# Patient Record
Sex: Female | Born: 1935 | Race: White | Hispanic: No | Marital: Married | Smoking: Never smoker
Health system: Southern US, Community
[De-identification: ages and names within clinical notes are randomized; demographics above are authoritative.]

## PROBLEM LIST (undated history)

## (undated) DIAGNOSIS — I10 Essential (primary) hypertension: Secondary | ICD-10-CM

## (undated) DIAGNOSIS — G8321 Monoplegia of upper limb affecting right dominant side: Secondary | ICD-10-CM

## (undated) DIAGNOSIS — D649 Anemia, unspecified: Secondary | ICD-10-CM

## (undated) DIAGNOSIS — C801 Malignant (primary) neoplasm, unspecified: Secondary | ICD-10-CM

## (undated) DIAGNOSIS — R569 Unspecified convulsions: Secondary | ICD-10-CM

## (undated) DIAGNOSIS — H9190 Unspecified hearing loss, unspecified ear: Secondary | ICD-10-CM

## (undated) DIAGNOSIS — R42 Dizziness and giddiness: Secondary | ICD-10-CM

## (undated) DIAGNOSIS — J189 Pneumonia, unspecified organism: Secondary | ICD-10-CM

## (undated) DIAGNOSIS — R269 Unspecified abnormalities of gait and mobility: Secondary | ICD-10-CM

## (undated) HISTORY — PX: BRAIN SURGERY: SHX531

---

## 2013-05-25 ENCOUNTER — Encounter (HOSPITAL_COMMUNITY): Payer: Self-pay | Admitting: *Deleted

## 2013-05-25 ENCOUNTER — Emergency Department (HOSPITAL_COMMUNITY): Payer: MEDICARE

## 2013-05-25 ENCOUNTER — Inpatient Hospital Stay (HOSPITAL_COMMUNITY): Payer: MEDICARE

## 2013-05-25 ENCOUNTER — Inpatient Hospital Stay (HOSPITAL_COMMUNITY)
Admission: EM | Admit: 2013-05-25 | Discharge: 2013-05-29 | DRG: 070 | Disposition: A | Discharge status: Home / Self Care | Payer: MEDICARE | Attending: Internal Medicine | Admitting: Internal Medicine

## 2013-05-25 DIAGNOSIS — Z87820 Personal history of traumatic brain injury: Secondary | ICD-10-CM

## 2013-05-25 DIAGNOSIS — Z87891 Personal history of nicotine dependence: Secondary | ICD-10-CM

## 2013-05-25 DIAGNOSIS — R569 Unspecified convulsions: Secondary | ICD-10-CM

## 2013-05-25 DIAGNOSIS — J189 Pneumonia, unspecified organism: Secondary | ICD-10-CM | POA: Diagnosis present

## 2013-05-25 DIAGNOSIS — E876 Hypokalemia: Secondary | ICD-10-CM | POA: Diagnosis not present

## 2013-05-25 DIAGNOSIS — R6889 Other general symptoms and signs: Secondary | ICD-10-CM

## 2013-05-25 DIAGNOSIS — G9389 Other specified disorders of brain: Secondary | ICD-10-CM | POA: Diagnosis present

## 2013-05-25 DIAGNOSIS — I2510 Atherosclerotic heart disease of native coronary artery without angina pectoris: Secondary | ICD-10-CM | POA: Diagnosis present

## 2013-05-25 DIAGNOSIS — G40909 Epilepsy, unspecified, not intractable, without status epilepticus: Secondary | ICD-10-CM | POA: Diagnosis present

## 2013-05-25 DIAGNOSIS — G9340 Encephalopathy, unspecified: Principal | ICD-10-CM | POA: Diagnosis present

## 2013-05-25 DIAGNOSIS — I10 Essential (primary) hypertension: Secondary | ICD-10-CM | POA: Diagnosis present

## 2013-05-25 DIAGNOSIS — Z79899 Other long term (current) drug therapy: Secondary | ICD-10-CM

## 2013-05-25 DIAGNOSIS — R4182 Altered mental status, unspecified: Secondary | ICD-10-CM | POA: Diagnosis present

## 2013-05-25 DIAGNOSIS — R32 Unspecified urinary incontinence: Secondary | ICD-10-CM | POA: Diagnosis present

## 2013-05-25 HISTORY — DX: Pneumonia, unspecified organism: J18.9

## 2013-05-25 HISTORY — DX: Essential (primary) hypertension: I10

## 2013-05-25 LAB — RAPID URINE DRUG SCREEN, HOSP PERFORMED
Amphetamines: NOT DETECTED
Barbiturates: POSITIVE — AB
Benzodiazepines: NOT DETECTED
Cocaine: NOT DETECTED
Opiates: NOT DETECTED
Tetrahydrocannabinol: NOT DETECTED

## 2013-05-25 LAB — POCT I-STAT, CHEM 8
Chloride: 100 mEq/L (ref 96–112)
Creatinine, Ser: 0.7 mg/dL (ref 0.50–1.10)
HCT: 43 % (ref 36.0–46.0)
Hemoglobin: 14.6 g/dL (ref 12.0–15.0)
Potassium: 3.6 mEq/L (ref 3.5–5.1)
Sodium: 136 mEq/L (ref 135–145)

## 2013-05-25 LAB — COMPREHENSIVE METABOLIC PANEL
AST: 26 U/L (ref 0–37)
Albumin: 4.1 g/dL (ref 3.5–5.2)
BUN: 16 mg/dL (ref 6–23)
CO2: 25 mEq/L (ref 19–32)
Calcium: 9 mg/dL (ref 8.4–10.5)
Chloride: 96 mEq/L (ref 96–112)
Creatinine, Ser: 0.66 mg/dL (ref 0.50–1.10)
GFR calc Af Amer: >90 mL/min (ref >90)
GFR calc non Af Amer: 82 mL/min — ABNORMAL LOW (ref >90)
Glucose, Bld: 111 mg/dL — ABNORMAL HIGH (ref 70–99)
Total Bilirubin: 0.4 mg/dL (ref 0.3–1.2)

## 2013-05-25 LAB — URINE MICROSCOPIC-ADD ON

## 2013-05-25 LAB — CBC
HCT: 37.3 % (ref 36.0–46.0)
Hemoglobin: 12.9 g/dL (ref 12.0–15.0)
MCV: 96.6 fL (ref 78.0–100.0)
Platelets: 136 10*3/uL — ABNORMAL LOW (ref 150–400)
WBC: 9.1 10*3/uL (ref 4.0–10.5)

## 2013-05-25 LAB — URINALYSIS, ROUTINE W REFLEX MICROSCOPIC
Nitrite: NEGATIVE
Protein, ur: NEGATIVE mg/dL
Specific Gravity, Urine: 1.012 (ref 1.005–1.030)
Urobilinogen, UA: 0.2 mg/dL (ref 0.0–1.0)
pH: 6 (ref 5.0–8.0)

## 2013-05-25 LAB — PHENYTOIN LEVEL, TOTAL: Phenytoin Lvl: 5.1 ug/mL — ABNORMAL LOW (ref 10.0–20.0)

## 2013-05-25 LAB — ETHANOL: Alcohol, Ethyl (B): <11 mg/dL (ref 0–11)

## 2013-05-25 LAB — APTT: aPTT: 31 seconds (ref 24–37)

## 2013-05-25 LAB — TROPONIN I: Troponin I: <0.3 ng/mL (ref <0.30)

## 2013-05-25 LAB — DIFFERENTIAL
Eosinophils Relative: 1 % (ref 0–5)
Lymphocytes Relative: 8 % — ABNORMAL LOW (ref 12–46)
Monocytes Absolute: 0.7 10*3/uL (ref 0.1–1.0)
Monocytes Relative: 8 % (ref 3–12)
Neutro Abs: 7.6 10*3/uL (ref 1.7–7.7)

## 2013-05-25 LAB — PROTIME-INR: Prothrombin Time: 14.1 seconds (ref 11.6–15.2)

## 2013-05-25 MED ORDER — RALOXIFENE HCL 60 MG PO TABS
60.0000 mg | ORAL_TABLET | Freq: Every day | ORAL | Status: DC
  Administered 2013-05-26 – 2013-05-29 (×4): 60 mg via ORAL
  Filled 2013-05-25 (×4): qty 1

## 2013-05-25 MED ORDER — SODIUM CHLORIDE 0.9 % IV BOLUS (SEPSIS)
1000.0000 mL | Freq: Once | INTRAVENOUS | Status: AC
  Administered 2013-05-25: 1000 mL via INTRAVENOUS

## 2013-05-25 MED ORDER — PHENYTOIN SODIUM EXTENDED 100 MG PO CAPS
100.0000 mg | ORAL_CAPSULE | Freq: Two times a day (BID) | ORAL | Status: DC
  Administered 2013-05-25: 100 mg via ORAL
  Filled 2013-05-25 (×3): qty 1

## 2013-05-25 MED ORDER — OSELTAMIVIR PHOSPHATE 75 MG PO CAPS
75.0000 mg | ORAL_CAPSULE | Freq: Two times a day (BID) | ORAL | Status: DC
  Administered 2013-05-26 – 2013-05-27 (×3): 75 mg via ORAL
  Filled 2013-05-25 (×5): qty 1

## 2013-05-25 MED ORDER — VITAMIN D3 25 MCG (1000 UNIT) PO TABS
1000.0000 [IU] | ORAL_TABLET | Freq: Every day | ORAL | Status: DC
  Administered 2013-05-26 – 2013-05-29 (×4): 1000 [IU] via ORAL
  Filled 2013-05-25 (×4): qty 1

## 2013-05-25 MED ORDER — ACETAMINOPHEN 325 MG PO TABS
650.0000 mg | ORAL_TABLET | Freq: Four times a day (QID) | ORAL | Status: DC | PRN
Start: 2013-05-25 — End: 2013-05-29
  Filled 2013-05-25: qty 2

## 2013-05-25 MED ORDER — SODIUM CHLORIDE 0.9 % IJ SOLN
3.0000 mL | Freq: Two times a day (BID) | INTRAMUSCULAR | Status: DC
  Administered 2013-05-25 – 2013-05-28 (×6): 3 mL via INTRAVENOUS

## 2013-05-25 MED ORDER — SODIUM CHLORIDE 0.9 % IV SOLN: INTRAVENOUS | Status: DC

## 2013-05-25 MED ORDER — OSELTAMIVIR PHOSPHATE 75 MG PO CAPS
75.0000 mg | ORAL_CAPSULE | Freq: Once | ORAL | Status: AC
  Administered 2013-05-25: 75 mg via ORAL
  Filled 2013-05-25 (×2): qty 1

## 2013-05-25 MED ORDER — VITAMIN B-12 1000 MCG PO TABS
1000.0000 ug | ORAL_TABLET | Freq: Every day | ORAL | Status: DC
  Administered 2013-05-26 – 2013-05-29 (×4): 1000 ug via ORAL
  Filled 2013-05-25 (×4): qty 1

## 2013-05-25 MED ORDER — PRIMIDONE 250 MG PO TABS
250.0000 mg | ORAL_TABLET | Freq: Two times a day (BID) | ORAL | Status: DC
  Administered 2013-05-25 – 2013-05-29 (×8): 250 mg via ORAL
  Filled 2013-05-25 (×9): qty 1

## 2013-05-25 MED ORDER — LABETALOL HCL 200 MG PO TABS
200.0000 mg | ORAL_TABLET | Freq: Two times a day (BID) | ORAL | Status: DC
  Administered 2013-05-25 – 2013-05-29 (×8): 200 mg via ORAL
  Filled 2013-05-25 (×9): qty 1

## 2013-05-25 MED ORDER — IOHEXOL 300 MG/ML  SOLN
75.0000 mL | Freq: Once | INTRAMUSCULAR | Status: AC | PRN
  Administered 2013-05-25: 75 mL via INTRAVENOUS

## 2013-05-25 MED ORDER — ONDANSETRON HCL 4 MG/2ML IJ SOLN: 4.0000 mg | Freq: Four times a day (QID) | INTRAMUSCULAR | Status: DC | PRN

## 2013-05-25 MED ORDER — ENOXAPARIN SODIUM 40 MG/0.4ML ~~LOC~~ SOLN
40.0000 mg | SUBCUTANEOUS | Status: DC
  Administered 2013-05-25 – 2013-05-28 (×4): 40 mg via SUBCUTANEOUS
  Filled 2013-05-25 (×5): qty 0.4

## 2013-05-25 MED ORDER — GABAPENTIN 600 MG PO TABS
600.0000 mg | ORAL_TABLET | Freq: Three times a day (TID) | ORAL | Status: DC
  Administered 2013-05-25 – 2013-05-29 (×11): 600 mg via ORAL
  Filled 2013-05-25 (×14): qty 1

## 2013-05-25 MED ORDER — ONDANSETRON HCL 4 MG PO TABS: 4.0000 mg | ORAL_TABLET | Freq: Four times a day (QID) | ORAL | Status: DC | PRN

## 2013-05-25 MED ORDER — ACETAMINOPHEN 325 MG PO TABS
650.0000 mg | ORAL_TABLET | Freq: Once | ORAL | Status: AC
  Administered 2013-05-25: 650 mg via ORAL
  Filled 2013-05-25: qty 2

## 2013-05-25 MED ORDER — PIPERACILLIN-TAZOBACTAM 3.375 G IVPB
3.3750 g | Freq: Three times a day (TID) | INTRAVENOUS | Status: DC
  Administered 2013-05-26 – 2013-05-29 (×10): 3.375 g via INTRAVENOUS
  Filled 2013-05-25 (×12): qty 50

## 2013-05-25 MED ORDER — GABAPENTIN 600 MG PO TABS
600.0000 mg | ORAL_TABLET | Freq: Two times a day (BID) | ORAL | Status: DC
  Filled 2013-05-25: qty 1

## 2013-05-25 MED ORDER — PIPERACILLIN-TAZOBACTAM 3.375 G IVPB
3.3750 g | Freq: Once | INTRAVENOUS | Status: AC
  Administered 2013-05-25: 3.375 g via INTRAVENOUS
  Filled 2013-05-25: qty 50

## 2013-05-25 MED ORDER — ACETAMINOPHEN 650 MG RE SUPP: 650.0000 mg | Freq: Four times a day (QID) | RECTAL | Status: DC | PRN

## 2013-05-25 MED ORDER — DOCUSATE SODIUM 100 MG PO CAPS
100.0000 mg | ORAL_CAPSULE | Freq: Two times a day (BID) | ORAL | Status: DC
  Administered 2013-05-25 – 2013-05-27 (×5): 100 mg via ORAL
  Filled 2013-05-25 (×5): qty 1

## 2013-05-25 NOTE — Progress Notes (Signed)
ANTIBIOTIC CONSULT NOTE - INITIAL  Pharmacy Consult for zosyn Indication: rule out pneumonia  No Known Allergies  Patient Measurements:   Adjusted Body Weight:   Vital Signs: Temp: 101.4 F (38.6 C) (12/26 1825) Temp src: Rectal (12/26 1634) BP: 118/92 mmHg (12/26 1634) Pulse Rate: 101 (12/26 1634) Intake/Output from previous day:   Intake/Output from this shift:    Labs:  Recent Labs  05/25/13 1608 05/25/13 1623  WBC 9.1  --   HGB 12.9 14.6  PLT 136*  --   CREATININE 0.66 0.70   CrCl is unknown because there is no height on file for the current visit. No results found for this basename: VANCOTROUGH, VANCOPEAK, VANCORANDOM, GENTTROUGH, GENTPEAK, GENTRANDOM, TOBRATROUGH, TOBRAPEAK, TOBRARND, AMIKACINPEAK, AMIKACINTROU, AMIKACIN,  in the last 72 hours   Microbiology: No results found for this or any previous visit (from the past 720 hour(s)).  Medical History: No past medical history on file.  Medications:  Anti-infectives   Start     Dose/Rate Route Frequency Ordered Stop   05/26/13 0100  piperacillin-tazobactam (ZOSYN) IVPB 3.375 g     3.375 g 12.5 mL/hr over 240 Minutes Intravenous Every 8 hours 05/25/13 1945     05/25/13 2200  oseltamivir (TAMIFLU) capsule 75 mg     75 mg Oral 2 times daily 05/25/13 1841 05/30/13 2159   05/25/13 1800  piperacillin-tazobactam (ZOSYN) IVPB 3.375 g     3.375 g 12.5 mL/hr over 240 Minutes Intravenous  Once 05/25/13 1751     05/25/13 1745  oseltamivir (TAMIFLU) capsule 75 mg     75 mg Oral  Once 05/25/13 1739 05/25/13 1856     Assessment: 81 yof presented to the hospital with AMS and called as a code stroke. To start empiric zosyn for r/o aspiration pneumonia. Also starting on empiric tamiflu. Tmax is 101.4, WBC WNL, Scr WNL.   Zosyn 12/26>> Tamiflu 12/26>>  Goal of Therapy:  Eradication of infection  Plan:  1. Zosyn 3.375gm IV x 1 over 30 minutes then 3.375gm IV Q8H (4 hr inf) 2. F/u renal fxn, C&S, clinical  status  Navea Woodrow, Drake Leach 05/25/2013,7:46 PM

## 2013-05-25 NOTE — ED Provider Notes (Signed)
CSN: [id number]     Arrival date & time 05/25/13  1612 History   First MD Initiated Contact with Patient 05/25/13 1613     Chief Complaint  Patient presents with  . Code Stroke   (Consider location/radiation/quality/duration/timing/severity/associated sxs/prior Treatment) HPI Comments: 80 year old female brought in by EMS for altered mental status. EMS called a code stroke as the patient was apparently at her normal baseline approximately one hour prior to arrival. The patient woke up this morning with fever up to 102, dry cough, sore throat/hoarseness, and diffuse muscle aches. The patient went to go lay down and then when the husband went to check on her found her on the ground. The patient seemed to fall off the bed. He thinks she is trying to get out of bed. 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 His weakness is not new today. Is not worse today. Patient does have a seizure history has not been missing her seizure medicines. The patient is altered when initially seen by me in the history is taken from the husband.   No past medical history on file. No past surgical history on file. No family history on file. History  Substance Use Topics  . Smoking status: Not on file  . Smokeless tobacco: Not on file  . Alcohol Use: Not on file   OB History   No data available     Review of Systems  Unable to perform ROS: Mental status change    Allergies  Review of patient's allergies indicates not on file.  Home Medications  No current outpatient prescriptions on file. BP 118/92  Pulse 101  Temp(Src) 101.4 F (38.6 C) (Rectal)  SpO2 92% Physical Exam  Vitals reviewed. Constitutional: She appears well-developed and well-nourished.  HENT:  Head: Normocephalic and atraumatic.  Right Ear: External ear normal.  Left Ear: External ear normal.  Nose: Nose normal.  Eyes: Right eye exhibits no discharge. Left eye exhibits no  discharge.  Cardiovascular: Regular rhythm and normal heart sounds.  Tachycardia present.   Pulmonary/Chest: Effort normal. She has rhonchi (intermittent).  Abdominal: Soft. There is no tenderness.  Neurological: She is alert. She is disoriented. No cranial nerve deficit. GCS eye subscore is 4. GCS verbal subscore is 4. GCS motor subscore is 6.  Chronic right-sided weakness, not worse than normal. Left side is at 5 out of 5 strength. No gross cranial nerve abnormality.  Skin: Skin is warm and dry.    ED Course  Procedures (including critical care time) Labs Review Labs Reviewed  CBC - Abnormal; Notable for the following:    RBC 3.86 (*)    Platelets 136 (*)    All other components within normal limits  DIFFERENTIAL - Abnormal; Notable for the following:    Neutrophils Relative % 84 (*)    Lymphocytes Relative 8 (*)    All other components within normal limits  COMPREHENSIVE METABOLIC PANEL - Abnormal; Notable for the following:    Sodium 134 (*)    Glucose, Bld 111 (*)    Alkaline Phosphatase 149 (*)    GFR calc non Af Amer 82 (*)    All other components within normal limits  GLUCOSE, CAPILLARY - Abnormal; Notable for the following:    Glucose-Capillary 111 (*)    All other components within normal limits  POCT I-STAT, CHEM 8 - Abnormal; Notable for the following:    Glucose, Bld 114 (*)  Calcium, Ion 1.07 (*)    All other components within normal limits  CULTURE, BLOOD (ROUTINE X 2)  CULTURE, BLOOD (ROUTINE X 2)  ETHANOL  PROTIME-INR  APTT  TROPONIN I  URINE RAPID DRUG SCREEN (HOSP PERFORMED)  URINALYSIS, ROUTINE W REFLEX MICROSCOPIC  PHENYTOIN LEVEL, TOTAL  INFLUENZA PANEL BY PCR  POCT I-STAT TROPONIN I   Imaging Review Ct Head Wo Contrast  05/25/2013   CLINICAL DATA:  Code stroke. Slurred speech. Question seizure. History of traumatic brain injury as a child.  EXAM: CT HEAD WITHOUT CONTRAST  TECHNIQUE: Contiguous axial images were obtained from the base of the  skull through the vertex without intravenous contrast.  COMPARISON:  None.  FINDINGS: Chronic encephalomalacia is present in the anterior left frontal lobe. There is a calvarial defect over this area which appears somewhat expanded. Ex vacuo dilation is noted in the left lateral ventricle. Moderate generalized atrophy and diffuse white matter disease is present bilaterally.  No acute cortical infarct, hemorrhage, or mass lesion is present. The ventricles are of normal size. No significant extra-axial fluid collection is present otherwise.  The paranasal sinuses and mastoid air cells are clear. The osseous skull is otherwise intact.  IMPRESSION: 1. No acute intracranial abnormality. 2. Chronic left frontal lobe encephalomalacia. 3. Moderate generalized white matter disease. This likely reflects the sequela of chronic microvascular ischemia. 4. The left frontal calvarial defect. This appears postsurgical of may be expanded. 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 Comparison with previous imaging would be useful. These results were called by telephone at the time of interpretation on 05/25/2013 at 4:29 PM to Dr. Ritta Slot , who verbally acknowledged these results.   Electronically Signed   By: Gennette Pac M.D.   On: 05/25/2013 16:29   Dg Chest Portable 1 View  05/25/2013   CLINICAL DATA:  Code stroke patient.  Slurred speech.  EXAM: PORTABLE CHEST - 1 VIEW  COMPARISON:  None.  FINDINGS: The patient has a right hilar mass measuring 5.9 cm craniocaudal by 4.5 cm transverse. The lungs appear emphysematous. No pneumothorax identified. Skin folds over the upper chest are noted. Heart size is upper normal.  IMPRESSION: Right hilar mass lesion. CT chest with contrast recommended for further evaluation.   Electronically Signed   By: Drusilla Kanner M.D.   On: 05/25/2013 17:13    EKG Interpretation   None       MDM   1. Altered mental status   2. Flu-like symptoms   3. Seizure    Patient's  mental status improved. She was able to follow commands but still seemed a little out of it per the family. As she is febrile, has multiple risk factors, and clinically has influenza I feel she needs admission to the hospital. Her altered mental status seems most consistent with a postictal state, however delirium due to influence is also a possibility. D/w hospitalist, will cover for flu with tamiflu and will cover for aspiration with zosyn per her request. No lobar PNA seen. Discussed mass with family, no prior history of it. Will need CT as inpatient.     Audree Camel, MD 05/26/13 [phone]

## 2013-05-25 NOTE — H&P (Signed)
[facility] History and Physical  Lisa Eaton QQV:[id number] DOB: 04-02-1932 DOA: 05/25/2013  Referring physician: Dr Lawrence Marseilles PCP: No primary provider on file.   Chief Complaint: AMS  HPI: Lisa Eaton is a 79 y.o. female with PMH significant for seizure disorder, brain trauma and brain surgery, chronic right side weakness who was found on the floor by husban. Patient was notice to be confuse, incontinent of urine, lethargic at that time. At 9;30 am patient took a nap. She was still sleeping at 13;00. Around 15;30 she was found on the floor by husband. Patient was notice to be febrile at that time at 101. She was initially a CODE stroke but it was cancelled.  Patient has been sick for last 2 days. She has had fever, generalized body aches, sore throat. No cough, no chest pain, no dyspnea. No tongue bite. Patient on initial evaluation was confuse, didn't know where she was at.    Review of Systems:  Negative except as per HPI.   past medical history; hypertension. Seizure, CAD, Brain trauma as a child, brain surgery, partial paralysis of right side,                         Surgical history; brain surgery.  Social History:  Used to smoke quit 20 years ago, she is retired from [employer], she drinks a glass of wine evrey night.   No Known Allergies  Family History; Father; multiple sclerosis.   Prior to Admission medications   Medication Sig Start Date End Date Taking? Authorizing Provider  amLODipine (NORVASC) 5 MG tablet Take 5 mg by mouth daily.   Yes Historical Provider, MD  cholecalciferol (VITAMIN D) 1000 UNITS tablet Take 1,000 Units by mouth daily.   Yes Historical Provider, MD  gabapentin (NEURONTIN) 600 MG tablet Take 600 mg by mouth 2 (two) times daily.   Yes Historical Provider, MD  labetalol (NORMODYNE) 200 MG tablet Take 200 mg by mouth 2 (two) times daily.   Yes Historical Provider, MD  losartan (COZAAR) 100 MG tablet Take 100 mg by mouth daily.   Yes Historical  Provider, MD  OVER THE COUNTER MEDICATION Take 1 tablet by mouth 2 (two) times daily. Medication:  Preseruision   Yes Historical Provider, MD  phenytoin (DILANTIN) 100 MG ER capsule Take 100 mg by mouth 2 (two) times daily.   Yes Historical Provider, MD  primidone (MYSOLINE) 250 MG tablet Take 250 mg by mouth 2 (two) times daily.   Yes Historical Provider, MD  raloxifene (EVISTA) 60 MG tablet Take 60 mg by mouth daily.   Yes Historical Provider, MD  vitamin B-12 (CYANOCOBALAMIN) 1000 MCG tablet Take 1,000 mcg by mouth daily.   Yes Historical Provider, MD   Physical Exam: Filed Vitals:   05/25/13 1825  BP:   Pulse:   Temp: 101.4 F (38.6 C)    BP 118/92  Pulse 101  Temp(Src) 101.4 F (38.6 C) (Rectal)  SpO2 92%  General:  Appears calm and comfortable, ill.  Eyes: PERRL, normal lids, irises & conjunctiva ENT: grossly normal hearing, lips & tongue dry.  Neck: no LAD, masses or thyromegaly Cardiovascular: Tachycardia RRR, no m/r/g. No LE edema. Telemetry: SR, tachycardia Respiratory: CTA bilaterally, no w/r/r. Normal respiratory effort. Abdomen: soft, ntnd Skin: no rash or induration seen on limited exam Musculoskeletal: grossly normal tone BUE/BLE Neurologic: oriented to place , person, situation. LE 5/5 streghn, left upper extremity 5/5, right upper extremity 3/5, speech is clear, slow  response. Lethargic, wake up to answer some question.           Labs on Admission:  Basic Metabolic Panel:  Recent Labs Lab 05/25/13 1608 05/25/13 1623  NA 134* 136  K 3.5 3.6  CL 96 100  CO2 25  --   GLUCOSE 111* 114*  BUN 16 17  CREATININE 0.66 0.70  CALCIUM 9.0  --    Liver Function Tests:  Recent Labs Lab 05/25/13 1608  AST 26  ALT 24  ALKPHOS 149*  BILITOT 0.4  PROT 7.4  ALBUMIN 4.1   CBC:  Recent Labs Lab 05/25/13 1608 05/25/13 1623  WBC 9.1  --   NEUTROABS 7.6  --   HGB 12.9 14.6  HCT 37.3 43.0  MCV 96.6  --   PLT 136*  --    Cardiac Enzymes:  Recent  Labs Lab 05/25/13 1608  TROPONINI <0.30    BNP (last 3 results) No results found for this basename: PROBNP,  in the last 8760 hours CBG:  Recent Labs Lab 05/25/13 1634  GLUCAP 111*    Radiological Exams on Admission: Ct Head Wo Contrast  05/25/2013   CLINICAL DATA:  Code stroke. Slurred speech. Question seizure. History of traumatic brain injury as a child.  EXAM: CT HEAD WITHOUT CONTRAST  TECHNIQUE: Contiguous axial images were obtained from the base of the skull through the vertex without intravenous contrast.  COMPARISON:  None.  FINDINGS: Chronic encephalomalacia is present in the anterior left frontal lobe. There is a calvarial defect over this area which appears somewhat expanded. Ex vacuo dilation is noted in the left lateral ventricle. Moderate generalized atrophy and diffuse white matter disease is present bilaterally.  No acute cortical infarct, hemorrhage, or mass lesion is present. The ventricles are of normal size. No significant extra-axial fluid collection is present otherwise.  The paranasal sinuses and mastoid air cells are clear. The osseous skull is otherwise intact.  IMPRESSION: 1. No acute intracranial abnormality. 2. Chronic left frontal lobe encephalomalacia. 3. Moderate generalized white matter disease. This likely reflects the sequela of chronic microvascular ischemia. 4. The left frontal calvarial defect. This appears postsurgical of may be expanded. 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 Comparison with previous imaging would be useful. These results were called by telephone at the time of interpretation on 05/25/2013 at 4:29 PM to Dr. Ritta Slot , who verbally acknowledged these results.   Electronically Signed   By: Gennette Pac M.D.   On: 05/25/2013 16:29   Dg Chest Portable 1 View  05/25/2013   CLINICAL DATA:  Code stroke patient.  Slurred speech.  EXAM: PORTABLE CHEST - 1 VIEW  COMPARISON:  None.  FINDINGS: The patient has a right hilar mass  measuring 5.9 cm craniocaudal by 4.5 cm transverse. The lungs appear emphysematous. No pneumothorax identified. Skin folds over the upper chest are noted. Heart size is upper normal.  IMPRESSION: Right hilar mass lesion. CT chest with contrast recommended for further evaluation.   Electronically Signed   By: Drusilla Kanner M.D.   On: 05/25/2013 17:13    EKG: Independently reviewed. Sinus Tachycardia.   Assessment/Plan Active Problems:   Altered mental status   Encephalopathy  1-Encephalopathy, Acute likely related to fever, infectious process. Also in the differential seizure. Unclear if patient missed some doses of her medications. Will defer to neuro work up for stroke. Treat underline cause.  2-History of seizure; continue with dilantin and primidone. Phenytoin level order.  3-Hypertension: continue with Labetalol. Hold  Norvasc and Cozaar SBP soft and in setting of infection.  4-Fever, URI: probably influenza. Influenza panel order. Start tamiflu. Will start Zosyn to cover for aspiration PNA , because of clinical presentation.  5-Hilar Mass on chest x ray; will order CT chest.   Code Status: presume full code.  Family Communication: Care discussed with husband and daughter at bedside.  Disposition Plan: expect 2 to 3 days.   Time spent: 75 minutes.   [facility] Pager [phone]

## 2013-05-25 NOTE — Code Documentation (Signed)
79 year old female presents to [facility] via [facility] as code stroke.  Husband and daughter report patient woke this AM with fever and aches-not feeling well at all.  LSW at 0930 when she went to take a nap.  Daughter reports she checked on her about 1300 and she was sleeping.  At approx 1530 the husband found her on the floor - some incontinence noted - and disoriented.  On arrival to ED she is drowsy but easily aroused - confused -thinks she is still in [place] - she is here visiting for the holidays - she denies headache - c/o of back pain "where i fell" and aches.  NIHSS 2 - missed question and right arm weakness - this is old from childhood TBI - although family states this has been recently getting worse.  She has hx seizures from TBI - is on Dilantin - but family states has not had a seizure in about 3 years.  The daughter states patient had fever 101 at home was given 2 po Tylenol Extra- Strength.  She feels very hot - rectal temp 101.7 is tachycardiac at 120-130 RR 24 - has cough with some Rhonchi - BP 118/57.  O2 sats 90% on RA -placed on 4 liter nasal cannula.  Dr. Amada Jupiter present - code stroke cancelled.  SIRS/sepsis workup initiated -  NS bolus started - handoff to [employer].  Addendum:  Patients LOC improving with fluids - now knows she is in [facility] - BP 138/67 HR 109. LA and BC ordered.

## 2013-05-25 NOTE — ED Notes (Signed)
DR. Amada Jupiter NOTOFIED OF LAB RESULTS OF CODE STROKE PATIENT TROPONIN = 0.01 ng/ml , chem 8 + results ,@16 :40 pm ,05/25/2013.

## 2013-05-25 NOTE — ED Notes (Signed)
Admitting MD at the bedside.  

## 2013-05-25 NOTE — Consult Note (Signed)
Neurology Consultation Reason for Consult: Altered mental status Referring Physician: Pricilla Loveless  CC: Altered mental status  History is obtained from: Patient, husband  HPI: Lisa Eaton is a 79 y.o. female with a history of seizures in the past and has not had a seizure for several years who presents with altered mental status. She awoke feeling achy, generalized fatigue and went to lie down. Later that day, the patient was found to be on the floor and had been incontinent. She seemed confused and therefore EMS was called.  On arrival, the patient was still confused, febrile.  Of note, the patient has had progressive memory difficulty over the past 3-4 years per her husband.  LKW: 9:30 AM tpa given?: no, outside of an    ROS: Unable to assess secondary to patient's altered mental status.   PMH: Traumatic brain injury as a child Seizure disorder Hypertension  Family History: Unable to assess secondary to patient's altered mental status.    Social History: Tob: Unable to assess secondary to patient's altered mental status.    Exam: Current vital signs: BP 153/83  Pulse 100  Temp(Src) 98 F (36.7 C) (Oral)  Resp 18  Ht 5\' 5"  (1.651 m)  Wt 54.84 kg (120 lb 14.4 oz)  BMI 20.12 kg/m2  SpO2 100% Vital signs in last 24 hours: Temp:  [98 F (36.7 C)-101.4 F (38.6 C)] 98 F (36.7 C) (12/26 2040) Pulse Rate:  [100-101] 100 (12/26 2040) Resp:  [18] 18 (12/26 2040) BP: (118-153)/(83-92) 153/83 mmHg (12/26 2040) SpO2:  [92 %-100 %] 100 % (12/26 2040) Weight:  [54.84 kg (120 lb 14.4 oz)] 54.84 kg (120 lb 14.4 oz) (12/26 2040)  General: In bed, NAD CV: Regular rate and rhythm Mental Status: Patient is awake, alert, answers questions quickly, but thinks that she is in [place]. No signs of aphasia or neglect Cranial Nerves: II: Visual Fields are full. Pupils are equal, round, and reactive to light.  Discs are difficult to visualize. III,IV, VI: EOMI without ptosis  or diploplia.  V: Facial sensation is symmetric to temperature VII: Facial movement is symmetric.  VIII: hearing is intact to voice X: Uvula elevates symmetrically XI: Shoulder shrug is symmetric. XII: tongue is midline without atrophy or fasciculations.  Motor: Tone is normal. Bulk is normal. 5/5 strength was present in left arm and bilateral legs, 4/5 in right arm Sensory: Sensation is symmetric to light touch and temperature in the arms and legs. Deep Tendon Reflexes: 2+ and symmetric in the biceps and patellae.  Cerebellar: FNF and HKS are intact bilaterally Gait: Not assessed due to acute nature of evaluation and multiple medical monitors in ED setting.  I have reviewed labs in epic and the results pertinent to this consultation are: No leukocytosis,but left shift Normal BMP  I have reviewed the images obtained: CT head-previous traumatic injury is seen  Impression: 79 year old female with altered mental status, I suspect that she is postictal. She has had symptoms earlier in the day suggestive of viral syndrome such as influenza. Any type of infection can lower seizure threshold in those prone the seizure. Also possible would be that she has a mild delirium given her possible memory problems  There is also a question of medication compliance given an irregular nature of her pill planner.  Recommendations: 1) increase gabapentin to 600 mg 3 times a day 2) continue home dose of primidone and phenytoin. 3) phenytoin level   Ritta Slot, MD [facility] [phone]  If 7pm- 7am,  please page neurology on call at [phone].

## 2013-05-25 NOTE — ED Notes (Signed)
Cancelled Code Stroke per Dr. Amada Jupiter

## 2013-05-25 NOTE — ED Notes (Signed)
Report Attempted

## 2013-05-26 DIAGNOSIS — G9340 Encephalopathy, unspecified: Principal | ICD-10-CM

## 2013-05-26 DIAGNOSIS — E876 Hypokalemia: Secondary | ICD-10-CM | POA: Diagnosis not present

## 2013-05-26 DIAGNOSIS — J189 Pneumonia, unspecified organism: Secondary | ICD-10-CM | POA: Diagnosis present

## 2013-05-26 DIAGNOSIS — R569 Unspecified convulsions: Secondary | ICD-10-CM

## 2013-05-26 LAB — COMPREHENSIVE METABOLIC PANEL
Albumin: 3.2 g/dL — ABNORMAL LOW (ref 3.5–5.2)
Alkaline Phosphatase: 111 U/L (ref 39–117)
BUN: 15 mg/dL (ref 6–23)
Chloride: 102 mEq/L (ref 96–112)
Creatinine, Ser: 0.88 mg/dL (ref 0.50–1.10)
GFR calc Af Amer: 71 mL/min — ABNORMAL LOW (ref >90)
GFR calc non Af Amer: 61 mL/min — ABNORMAL LOW (ref >90)
Glucose, Bld: 120 mg/dL — ABNORMAL HIGH (ref 70–99)
Potassium: 3.4 mEq/L — ABNORMAL LOW (ref 3.5–5.1)
Total Protein: 6.2 g/dL (ref 6.0–8.3)

## 2013-05-26 LAB — CBC
HCT: 34 % — ABNORMAL LOW (ref 36.0–46.0)
Hemoglobin: 11.4 g/dL — ABNORMAL LOW (ref 12.0–15.0)
MCH: 32.8 pg (ref 26.0–34.0)
MCHC: 33.5 g/dL (ref 30.0–36.0)
MCV: 97.7 fL (ref 78.0–100.0)
Platelets: 135 10*3/uL — ABNORMAL LOW (ref 150–400)
RDW: 14.3 % (ref 11.5–15.5)

## 2013-05-26 LAB — INFLUENZA PANEL BY PCR (TYPE A & B): H1N1 flu by pcr: NOT DETECTED

## 2013-05-26 MED ORDER — PHENYTOIN SODIUM EXTENDED 100 MG PO CAPS
100.0000 mg | ORAL_CAPSULE | Freq: Three times a day (TID) | ORAL | Status: DC
  Administered 2013-05-26 – 2013-05-29 (×10): 100 mg via ORAL
  Filled 2013-05-26 (×12): qty 1

## 2013-05-26 MED ORDER — POTASSIUM CHLORIDE CRYS ER 20 MEQ PO TBCR
40.0000 meq | EXTENDED_RELEASE_TABLET | Freq: Once | ORAL | Status: AC
  Administered 2013-05-26: 40 meq via ORAL
  Filled 2013-05-26: qty 2
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
## 2013-05-26 NOTE — Progress Notes (Signed)
Subjective: Asleep on entering, but awakens easily.   Exam: Filed Vitals:   05/26/13 0930  BP: 114/47  Pulse: 93  Temp: 98.1 F (36.7 C)  Resp: 18   Gen: In bed, NAD MS: Awakens easily, seems somnolent, oriented to place and year.  WU:JWJXB, EOMI, some dysarthria.  Motor: 4/5 RUE, 4+/5 RLE Sensory:intact to LT  Leukocytosis today  Impression: 79 yo F with AMS and fever on presentationn, seems slightly better today. Pneumonia on chest CT. Oriented. She is on antibiotics. She has some memory trouble at baseline, so whether she has delirium alone or was post-ictal on presentation is not clear.   Recommendations: 1) Continue gabapentin 600mg  TID 2) Avoid flouroquinolones.  3) dilantin per pharmacy 4) continue primidone at home dose.   Ritta Slot, MD [facility] [phone]  If 7pm- 7am, please page neurology on call at [phone].
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
## 2013-05-27 NOTE — Progress Notes (Signed)
Subjective: Awake, NAD  Exam: Filed Vitals:   05/27/13 1335  BP: 132/52  Pulse: 92  Temp: 98.4 F (36.9 C)  Resp: 19   Gen: In bed, NAD MS: Awake, Alert, oriented ZO:XWRUE, EOMI Motor: 4/5 RUE, 4+/5 RLE Sensory:intact to LT  CT shows consolidation, pna  Impression: 79 yo F with AMS and fever on presentationn, seems slightly better today. Pneumonia on chest CT. Oriented. She is on antibiotics. She has some memory trouble at baseline, so whether she has delirium alone or was post-ictal on presentation is not clear. Dilantin was subtheraputic, there is some concern for medication compliance, but dose was also recently reduced.   Recommendations: 1) Continue gabapentin 600mg  TID 2) Avoid flouroquinolones.  3) dilantin per pharmacy 4) continue primidone at home dose.  5) Will sign off at this time, please call with any questions.   Ritta Slot, MD [facility] [phone]  If 7pm- 7am, please page neurology on call at [phone].
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
## 2013-05-27 NOTE — Evaluation (Signed)
Physical Therapy Evaluation Patient Details Name: Lisa Eaton MRN: [id number] DOB: 1931-12-12 Today's Date: 05/27/2013 Time: 2130-8657 PT Time Calculation (min): 27 min  PT Assessment / Plan / Recommendation History of Present Illness  Lisa Eaton is a 79 y.o. female with PMH significant for seizure disorder, brain trauma and brain surgery, chronic right side weakness who was found on the floor by husban. Patient was notice to be confuse, incontinent of urine, lethargic at that time. At 9;30 am patient took a nap. She was still sleeping at 13;00. Around 15;30 she was found on the floor by husband. Patient was notice to be febrile at that time at 101. She was initially a CODE stroke but it was cancelled. Noted to have pneumonia  Clinical Impression   Pt admitted with above. Pt currently with functional limitations due to the deficits listed below (see PT Problem List).  Pt will benefit from skilled PT to increase their independence and safety with mobility to allow discharge to the venue listed below.       PT Assessment  Patient needs continued PT services    Follow Up Recommendations  Outpatient PT;Other (comment) (back to Outpatient Therapies in [place])    Does the patient have the potential to tolerate intense rehabilitation      Barriers to Discharge        Equipment Recommendations  None recommended by PT    Recommendations for Other Services OT consult   Frequency Min 3X/week    Precautions / Restrictions Precautions Precautions: Fall   Pertinent Vitals/Pain no apparent distress       Mobility  Transfers Transfers: Sit to Stand;Stand to Sit Sit to Stand: 4: Min guard;From bed Stand to Sit: 4: Min guard;To chair/3-in-1 Details for Transfer Assistance: Cues for technqiue and hand placement; close guard for safety; decr control of descent with stnad to sit Ambulation/Gait Ambulation/Gait Assistance: 5: Supervision;4: Min guard Ambulation Distance (Feet): 110  Feet Assistive device: None;Other (Comment) (and occasionally pushing IV pole) Ambulation/Gait Assistance Details: Supervision initially, however noted with incr distance and fatigue several small losses of balance, then requiring minguard for safety; will plan to try cane next session Stairs: Yes Stairs Assistance: 4: Min guard Stair Management Technique: One rail Left;Forwards Number of Stairs: 2    Exercises     PT Diagnosis: Difficulty walking  PT Problem List: Decreased activity tolerance;Decreased balance;Decreased coordination;Decreased knowledge of use of DME;Decreased safety awareness PT Treatment Interventions: DME instruction;Gait training;Stair training;Functional mobility training;Therapeutic activities;Therapeutic exercise;Balance training;Patient/family education     PT Goals(Current goals can be found in the care plan section) Acute Rehab PT Goals Patient Stated Goal: home PT Goal Formulation: With patient Time For Goal Achievement: 06/03/13 Potential to Achieve Goals: Good  Visit Information  Last PT Received On: 05/27/13 Assistance Needed: +1 History of Present Illness: Lisa Eaton is a 79 y.o. female with PMH significant for seizure disorder, brain trauma and brain surgery, chronic right side weakness who was found on the floor by husban. Patient was notice to be confuse, incontinent of urine, lethargic at that time. At 9;30 am patient took a nap. She was still sleeping at 13;00. Around 15;30 she was found on the floor by husband. Patient was notice to be febrile at that time at 101. She was initially a CODE stroke but it was cancelled.        Prior Functioning  Home Living Family/patient expects to be discharged to:: Private residence Living Arrangements: Children;Spouse/significant other Available Help at Discharge: Family;Available 24 hours/day  Type of Home: House Home Access: Stairs to enter [employer] of Steps: 5 Entrance Stairs-Rails:  Left Home Layout: Two level Alternate Level Stairs-Number of Steps: 12 Alternate Level Stairs-Rails: Left Home Equipment: None Prior Function Level of Independence: Needs assistance Comments: Assistance with home management Communication Communication: No difficulties Dominant Hand: Left    Cognition  Cognition Arousal/Alertness: Awake/alert Behavior During Therapy: WFL for tasks assessed/performed Overall Cognitive Status: Within Functional Limits for tasks assessed    Extremity/Trunk Assessment Upper Extremity Assessment Upper Extremity Assessment: RUE deficits/detail RUE Deficits / Details: Noted decr use of RUE/hand; able to extend elbow; decr coordination RUE Coordination: decreased fine motor Lower Extremity Assessment Lower Extremity Assessment: Overall WFL for tasks assessed   Balance    End of Session PT - End of Session Equipment Utilized During Treatment: Gait belt Activity Tolerance: Patient tolerated treatment well Patient left: in chair;with call bell/phone within reach;with family/visitor present Nurse Communication: Mobility status  GP     Olen Pel [place], [place] [phone]  05/27/2013, 4:44 PM

## 2013-05-27 NOTE — Plan of Care (Signed)
Problem: Phase II Progression Outcomes Goal: Discharge plan established Outcome: Completed/Met Date Met:  05/27/13 Patient to be discharged to home.

## 2013-05-28 DIAGNOSIS — R6889 Other general symptoms and signs: Secondary | ICD-10-CM

## 2013-05-28 LAB — CBC
HCT: 35.3 % — ABNORMAL LOW (ref 36.0–46.0)
MCHC: 33.1 g/dL (ref 30.0–36.0)
Platelets: 134 10*3/uL — ABNORMAL LOW (ref 150–400)
RBC: 3.58 MIL/uL — ABNORMAL LOW (ref 3.87–5.11)
RDW: 14.2 % (ref 11.5–15.5)
WBC: 6.1 10*3/uL (ref 4.0–10.5)

## 2013-05-28 LAB — BASIC METABOLIC PANEL
BUN: 8 mg/dL (ref 6–23)
CO2: 26 mEq/L (ref 19–32)
Chloride: 104 mEq/L (ref 96–112)
GFR calc Af Amer: >90 mL/min (ref >90)
GFR calc non Af Amer: 81 mL/min — ABNORMAL LOW (ref >90)
Potassium: 3.5 mEq/L (ref 3.5–5.1)

## 2013-05-28 NOTE — Progress Notes (Signed)
[facility] PROGRESS NOTE  Lisa Eaton WUX:[id number] DOB: 1931/12/11 DOA: 05/25/2013 PCP: No primary provider on file.   Brief narrative  79 year old female with history of seizure disorder, history of brain trauma and brain surgery, chronic right sided weakness who was found on the floor by her husband incontinent of urine and confused. Patient was also febrile to 101F. Code stroke was called but canceled as duration of symptoms unknown. Patient reportedly had fevers and generalized body ache and sore throat for past 2 days. She was admitted for possible seizures.  Assessment/Plan: Acute encephalopathy Possibly in the setting of seizures. Patient has underlying fever with flulike symptoms which could lower the seizure threshold. Her Dilantin level was subtherapeutic as patient informs that her neurologist reduced the levels recently 200 mg twice a day (was on 400 mg daily at home previously). -Increased gabapentin dose as per neurology recommendation. I will increase her phenytoin dose 200 mg 2 times a day. -Maintain seizure precautions -Continue home dose primidone -PT eval  Fever with Flu like symptoms . Flu PCR negative. Discontinue empiric Tamiflu CT chest done for masslike opacity over right lung  Noted on  chest x-ray suggestive of focal consolidation likely due to pneumonia. We'll treat her empirically with Zosyn.( Levaquin has a very low incidence of reducing seizure threshold) Fever again last PM  Hilar mass and chest x-ray CT chest suggestive of focal consolidation. Recommend followup chest x-ray in 2-4 weeks following treatment to evaluate for resolution.  Hypertension Continue labetalol. Remaining home medications held as blood pressure was soft on presentation  Hypokalemia replete  Leukocytosis -recheck labs  DVT prophylaxis: Lovenox   Code Status: Full code Family Communication: None at bedside Disposition Plan: She lives in [facility]  [place] and sees  neurologist there. D/c once afebrile  Consultants:  Neurology  Procedures:  None  Antibiotics:  IV Zosyn (12/26-)    HPI/Subjective: Has plane on 12/31- wants to be d/c'd  Objective: Filed Vitals:   05/28/13 0524  BP: 117/55  Pulse: 70  Temp: 97.5 F (36.4 C)  Resp: 18    Intake/Output Summary (Last 24 hours) at 05/28/13 0918 Last data filed at 05/27/13 1018  Gross per 24 hour  Intake      3 ml  Output      0 ml  Net      3 ml   Filed Weights   05/25/13 2040  Weight: 54.84 kg (120 lb 14.4 oz)    Exam:   General:  Elderly female lying in bed in no acute distress- tearful  HEENT: No pallor, moist oral mucosa  Chest: Clear to auscultation bilaterally, no added sound  CVS: Normal S1 and S2, no murmurs rub or gallop  Abdomen: Soft, nontender, nondistended, bowel sounds present  Extremities: Warm, no edema  CNS: AAO x3, 4/5 power over rt UE. normal power in all other limbs  Data Reviewed: Basic Metabolic Panel:  Recent Labs Lab 05/25/13 1608 05/25/13 1623 05/26/13 0700  NA 134* 136 138  K 3.5 3.6 3.4*  CL 96 100 102  CO2 25  --  23  GLUCOSE 111* 114* 120*  BUN 16 17 15   CREATININE 0.66 0.70 0.88  CALCIUM 9.0  --  8.0*   Liver Function Tests:  Recent Labs Lab 05/25/13 1608 05/26/13 0700  AST 26 27  ALT 24 23  ALKPHOS 149* 111  BILITOT 0.4 0.6  PROT 7.4 6.2  ALBUMIN 4.1 3.2*   No results found for this basename: LIPASE,  AMYLASE,  in the last 168 hours No results found for this basename: AMMONIA,  in the last 168 hours CBC:  Recent Labs Lab 05/25/13 1608 05/25/13 1623 05/26/13 0700  WBC 9.1  --  11.0*  NEUTROABS 7.6  --   --   HGB 12.9 14.6 11.4*  HCT 37.3 43.0 34.0*  MCV 96.6  --  97.7  PLT 136*  --  135*   Cardiac Enzymes:  Recent Labs Lab 05/25/13 1608  TROPONINI <0.30   BNP (last 3 results) No results found for this basename: PROBNP,  in the last 8760 hours CBG:  Recent Labs Lab 05/25/13 1634  GLUCAP 111*     Recent Results (from the past 240 hour(s))  CULTURE, BLOOD (ROUTINE X 2)     Status: None   Collection Time    05/25/13  9:50 PM      Result Value Range Status   Specimen Description BLOOD LEFT ARM   Final   Special Requests BOTTLES DRAWN AEROBIC ONLY 10CC   Final   Culture  Setup Time     Final   Value: 05/26/2013 12:50     Performed at [facility]   Culture     Final   Value:        BLOOD CULTURE RECEIVED NO GROWTH TO DATE CULTURE WILL BE HELD FOR 5 DAYS BEFORE ISSUING A FINAL NEGATIVE REPORT     Performed at [facility]   Report Status PENDING   Incomplete  CULTURE, BLOOD (ROUTINE X 2)     Status: None   Collection Time    05/25/13 10:00 PM      Result Value Range Status   Specimen Description BLOOD LEFT ARM   Final   Special Requests BOTTLES DRAWN AEROBIC ONLY [facility]   Final   Culture  Setup Time     Final   Value: 05/26/2013 12:50     Performed at [facility]   Culture     Final   Value:        BLOOD CULTURE RECEIVED NO GROWTH TO DATE CULTURE WILL BE HELD FOR 5 DAYS BEFORE ISSUING A FINAL NEGATIVE REPORT     Performed at [facility]   Report Status PENDING   Incomplete     Studies: No results found.  Scheduled Meds: . cholecalciferol  1,000 Units Oral Daily  . docusate sodium  100 mg Oral BID  . enoxaparin (LOVENOX) injection  40 mg Subcutaneous Q24H  . gabapentin  600 mg Oral TID  . labetalol  200 mg Oral BID  . phenytoin  100 mg Oral TID  . piperacillin-tazobactam (ZOSYN)  IV  3.375 g Intravenous Q8H  . primidone  250 mg Oral BID  . raloxifene  60 mg Oral Daily  . sodium chloride  3 mL Intravenous Q12H  . vitamin B-12  1,000 mcg Oral Daily   Continuous Infusions:      Time spent: 25 minutes    Lisa Eaton  [facility] Pager [phone] If 7PM-7AM, please contact night-coverage at [email or web address], password [facility] 05/28/2013, 9:18 AM  LOS: 3 days

## 2013-05-28 NOTE — Progress Notes (Signed)
MEDICATION RELATED CONSULT NOTE - FOLLOW UP  Pharmacy Consult for Phenytoin & Zosyn Indication: Seizure prophylaxis and r/o aspiration PNA  No Known Allergies  Patient Measurements: Height: 5\' 5"  (165.1 cm) Weight: 120 lb 14.4 oz (54.84 kg) IBW/kg (Calculated) : 57  Vital Signs: Temp: 98 F (36.7 C) (12/29 0918) Temp src: Oral (12/29 0918) BP: 141/70 mmHg (12/29 0918) Pulse Rate: 74 (12/29 0918) Intake/Output from previous day: 12/28 0701 - 12/29 0700 In: 3 [I.V.:3] Out: -  Intake/Output from this shift:    Labs:  Recent Labs  05/25/13 1608 05/25/13 1623 05/26/13 0700 05/28/13 0900  WBC 9.1  --  11.0* 6.1  HGB 12.9 14.6 11.4* 11.7*  HCT 37.3 43.0 34.0* 35.3*  PLT 136*  --  135* 134*  APTT 31  --   --   --   CREATININE 0.66 0.70 0.88  --   ALBUMIN 4.1  --  3.2*  --   PROT 7.4  --  6.2  --   AST 26  --  27  --   ALT 24  --  23  --   ALKPHOS 149*  --  111  --   BILITOT 0.4  --  0.6  --    Estimated Creatinine Clearance: 44.8 ml/min (by C-G formula based on Cr of 0.88).   Microbiology: Recent Results (from the past 720 hour(s))  CULTURE, BLOOD (ROUTINE X 2)     Status: None   Collection Time    05/25/13  9:50 PM      Result Value Range Status   Specimen Description BLOOD LEFT ARM   Final   Special Requests BOTTLES DRAWN AEROBIC ONLY 10CC   Final   Culture  Setup Time     Final   Value: 05/26/2013 12:50     Performed at [facility]   Culture     Final   Value:        BLOOD CULTURE RECEIVED NO GROWTH TO DATE CULTURE WILL BE HELD FOR 5 DAYS BEFORE ISSUING A FINAL NEGATIVE REPORT     Performed at [facility]   Report Status PENDING   Incomplete  CULTURE, BLOOD (ROUTINE X 2)     Status: None   Collection Time    05/25/13 10:00 PM      Result Value Range Status   Specimen Description BLOOD LEFT ARM   Final   Special Requests BOTTLES DRAWN AEROBIC ONLY 7CC   Final   Culture  Setup Time     Final   Value: 05/26/2013 12:50     Performed at  [facility]   Culture     Final   Value:        BLOOD CULTURE RECEIVED NO GROWTH TO DATE CULTURE WILL BE HELD FOR 5 DAYS BEFORE ISSUING A FINAL NEGATIVE REPORT     Performed at [facility]   Report Status PENDING   Incomplete    Medications:  Zosyn 3.375g IV every 8 hours (infused over 4 hours) Phenytoin ER 100 mg tid  Assessment: 79 y.o. F who presented on 12/26 with AMS and called as a code stroke. Imaging was negative for a new CVA. Pharmacy was consulted to manage phenytoin for seizure prophylaxis and Zosyn for r/o aspiration PNA coverage.  The patient's dilantin level was low on the known home dose of 100 mg bid. The dose was adjusted on 12/27 - no seizure activity has been noted since that time. Will continue at  this dose for now and will f/u with another dilantin level at steady state in 5-7 days.  The patient continues on Zosyn for r/o aspiration PNA. Imagine from 12/26 shows likely PNA - no repeat imaging has been done since that time. Tmax/24h: 101.2, WBC 6.1 << 11. SCr 0.88, CrCl~40-50 ml/min. Zosyn dose remains appropriate at this time.   Goal of Therapy:  Phenytoin level of 10-20 mcg/ml (when corrected for albumin and renal function) Proper antibiotics for infection/cultures adjusted for renal/hepatic function   Plan:  1. Continue Phenytoin ER 100 mg tid 2. Continue Zosyn 3.375g IV every 8 hours 3. Will continue to follow renal function, culture results, LOT, and antibiotic de-escalation plans  4. Will continue to monitor for any s/sx of seizure activity and will plant to check a DPH level/albumin at steady state in 5-7 days.   Georgina Pillion, PharmD, BCPS Clinical Pharmacist Pager: [phone] 05/28/2013 9:41 AM

## 2013-05-28 NOTE — Progress Notes (Signed)
UR complete.  Aparna Vanderweele RN, MSN 

## 2013-05-28 NOTE — Progress Notes (Signed)
Physical Therapy Treatment Patient Details Name: Lisa Eaton MRN: [id number] DOB: April 23, 1932 Today's Date: 05/28/2013 Time: 4098-1191 PT Time Calculation (min): 38 min  PT Assessment / Plan / Recommendation  History of Present Illness Lisa Eaton is a 79 y.o. female with PMH significant for seizure disorder, brain trauma and brain surgery, chronic right side weakness who was found on the floor by husban. Patient was notice to be confuse, incontinent of urine, lethargic at that time. At 9;30 am patient took a nap. She was still sleeping at 13;00. Around 15;30 she was found on the floor by husband. Patient was notice to be febrile at that time at 101. She was initially a CODE stroke but it was cancelled.    PT Comments   Pt more unsteady today and requiring A to prevent falls during ambulation.  Trialed cane and hemi-walker with pt stating she did not like either AD, however pt most stable with hemi-walker.  Discussed need to have 24hr A and for having A when returns to [place] and husband goes back to work.  Will continue to follow.    Follow Up Recommendations  Outpatient PT;Other (comment) (back to Outpatient Therapies in [place])     Does the patient have the potential to tolerate intense rehabilitation     Barriers to Discharge        Equipment Recommendations  Other (comment) (Would benefit from [facility], however pt doesn't want it. )    Recommendations for Other Services OT consult  Frequency Min 3X/week   Progress towards PT Goals Progress towards PT goals: Not progressing toward goals - comment (Decreased balance today.  )  Plan Current plan remains appropriate    Precautions / Restrictions Precautions Precautions: Fall Restrictions Weight Bearing Restrictions: No   Pertinent Vitals/Pain Denied pain.      Mobility  Bed Mobility Bed Mobility: Not assessed Transfers Transfers: Sit to Stand;Stand to Sit Sit to Stand: 4: Min guard;With upper extremity assist;From  chair/3-in-1 Stand to Sit: 4: Min guard;With upper extremity assist;To chair/3-in-1 Details for Transfer Assistance: Cues for UE use and controlling descent to chair.   Ambulation/Gait Ambulation/Gait Assistance: 4: Min assist Ambulation Distance (Feet): 40 Feet (x2) Assistive device: Hemi-walker;Straight cane Ambulation/Gait Assistance Details: Trialed SPC and Hemi walker working on pt's balance and safety with mobility.  While using cane pt had 3 LOB requiring A to prevent fall and consistent MinA.  While using Hemi-walker pt was more MinG, but did have one LOB requiring A to prevent fall.  pt stating she does not like using either AD despite c/o feeling unsteady this am.   Gait Pattern: Step-through pattern;Decreased stride length;Scissoring;Ataxic;Narrow base of support Stairs: No Wheelchair Mobility Wheelchair Mobility: No    Exercises     PT Diagnosis:    PT Problem List:   PT Treatment Interventions:     PT Goals (current goals can now be found in the care plan section) Acute Rehab PT Goals Patient Stated Goal: home PT Goal Formulation: With patient Time For Goal Achievement: 06/03/13 Potential to Achieve Goals: Good  Visit Information  Last PT Received On: 05/28/13 Assistance Needed: +1 History of Present Illness: Lisa Eaton is a 79 y.o. female with PMH significant for seizure disorder, brain trauma and brain surgery, chronic right side weakness who was found on the floor by husban. Patient was notice to be confuse, incontinent of urine, lethargic at that time. At 9;30 am patient took a nap. She was still sleeping at 13;00. Around 15;30 she was found  on the floor by husband. Patient was notice to be febrile at that time at 101. She was initially a CODE stroke but it was cancelled.     Subjective Data  Patient Stated Goal: home   Cognition  Cognition Arousal/Alertness: Awake/alert Behavior During Therapy: WFL for tasks assessed/performed Overall Cognitive Status:  History of cognitive impairments - at baseline Memory: Decreased short-term memory    Balance  Balance Balance Assessed: Yes Static Standing Balance Static Standing - Balance Support: Left upper extremity supported Static Standing - Level of Assistance: 5: Stand by assistance  End of Session PT - End of Session Equipment Utilized During Treatment: Gait belt Activity Tolerance: Patient tolerated treatment well Patient left: in chair;with call bell/phone within reach Nurse Communication: Mobility status   GP     Sunny Schlein, [place] [phone] 05/28/2013, 9:06 AM

## 2013-05-29 DIAGNOSIS — R4182 Altered mental status, unspecified: Secondary | ICD-10-CM

## 2013-05-29 MED ORDER — PHENYTOIN SODIUM EXTENDED 100 MG PO CAPS: 100.0000 mg | ORAL_CAPSULE | Freq: Three times a day (TID) | ORAL | Status: AC

## 2013-05-29 MED ORDER — DOXYCYCLINE HYCLATE 100 MG PO CAPS: 100.0000 mg | ORAL_CAPSULE | Freq: Two times a day (BID) | ORAL | Status: AC

## 2013-05-29 NOTE — Progress Notes (Signed)
Patient is discharged from room 4N01 at this time. Alert and in stable condition. IV site d/c'd. Instructions read to patient, verbalized understanding and husband signed form. Left unit via wheelchair.

## 2013-05-29 NOTE — Progress Notes (Signed)
UR complete.  Alyna Stensland RN, MSN 

## 2013-05-29 NOTE — Discharge Instructions (Signed)

## 2013-05-29 NOTE — Care Management Note (Signed)
    Page 1 of 1   05/29/2013     3:03:34 PM   CARE MANAGEMENT NOTE 05/29/2013  Patient:  Lisa Eaton, Lisa Eaton   Account Number:  [id number]  Date Initiated:  05/29/2013  Documentation initiated by:  Elmer Bales  Subjective/Objective Assessment:   Patient admitted with pneumonia.  Patient is from [facility] [place] and will be returning there at discharge.     Action/Plan:   Will follow for discharge needs.  Discharge summary will be faxed to PCP and Neurologist at discharge   Anticipated DC Date:  05/29/2013   Anticipated DC Plan:  HOME/SELF CARE      DC Planning Services  CM consult      Choice offered to / List presented to:             Status of service:  Completed, signed off Medicare Important Message given?   (If response is "NO", the following Medicare IM given date fields will be blank) Date Medicare IM given:   Date Additional Medicare IM given:    Discharge Disposition:  HOME/SELF CARE  Per UR Regulation:  Reviewed for med. necessity/level of care/duration of stay  If discussed at Long Length of Stay Meetings, dates discussed:    Comments:  05/29/13 Elmer Bales RN, MSN, CM- Discharge summary was faxed per Dr Burna Sis request.   05/29/13  0930 Elmer Bales RN, MSN, CM- Met with patient to obtain the names of her PCP and neurologist. Patient states that her PCP is Dr Francene Finders [phone] and her neurologist is Dr Donley Redder [phone]. CM will contact both physicians' offices when they are open.

## 2013-05-29 NOTE — Discharge Summary (Signed)
Physician Discharge Summary  Lisa Eaton OZH:[id number] DOB: 1932-05-27 DOA: 05/25/2013  PCP: No primary provider on file.- will fax to PCP in [place]  Admit date: 05/25/2013 Discharge date: 05/29/2013  Time spent: 35  minutes  Recommendations for Outpatient Follow-up:  1. Repeat chest xray to ensure resolution of PNA  Discharge Diagnoses:  Active Problems:   Altered mental status   Encephalopathy   CAP (community acquired pneumonia)   Convulsions/seizures   Hypokalemia   Discharge Condition: improved  Diet recommendation: cardiac  Filed Weights   05/25/13 2040 05/29/13 0430  Weight: 54.84 kg (120 lb 14.4 oz) 54.4 kg (119 lb 14.9 oz)    History of present illness:  Lisa Eaton is a 79 y.o. female with PMH significant for seizure disorder, brain trauma and brain surgery, chronic right side weakness who was found on the floor by husban. Patient was notice to be confuse, incontinent of urine, lethargic at that time. At 9;30 am patient took a nap. She was still sleeping at 13;00. Around 15;30 she was found on the floor by husband. Patient was notice to be febrile at that time at 101. She was initially a CODE stroke but it was cancelled.  Patient has been sick for last 2 days. She has had fever, generalized body aches, sore throat. No cough, no chest pain, no dyspnea. No tongue bite. Patient on initial evaluation was confuse, didn't know where she was at.    Hospital Course:  Acute encephalopathy  Possibly in the setting of seizures. Patient has underlying fever with flulike symptoms which could lower the seizure threshold. Her Dilantin level was subtherapeutic as patient informs that her neurologist reduced the levels recently 200 mg twice a day (was on 400 mg daily at home previously).  -Increased gabapentin dose as per neurology recommendation. I will increase her phenytoin dose 200 mg 2 times a day.  -Maintain seizure precautions  -Continue home dose primidone  -PT eval  - outpatient PT  Fever with Flu like symptoms  Flu PCR negative. Discontinue empiric Tamiflu  CT chest done for masslike opacity over right lung Noted on chest x-ray suggestive of focal consolidation likely due to pneumonia.  abx    Hilar mass and chest x-ray  CT chest suggestive of focal consolidation. Recommend followup chest x-ray in 2-4 weeks following treatment to evaluate for resolution.   Hypertension  Resume home meds at d/c bp improved  Hypokalemia  replete   Leukocytosis  resolved   Procedures:  none  Consultations:  neuro  Discharge Exam: Filed Vitals:   05/29/13 0430  BP: 126/52  Pulse: 81  Temp: 98.3 F (36.8 C)  Resp: 18    General: A+Ox3, NAd Cardiovascular: rrr Respiratory: clear anterior  Discharge Instructions  Discharge Orders   Future Orders Complete By Expires   Diet - low sodium heart healthy  As directed    Discharge instructions  As directed    Comments:     Repeat chest x ray 4-6 weeks to ensure resolution of PNA Outpatient PT- to be arranged by PCP in [place]   Increase activity slowly  As directed        Medication List    STOP taking these medications       OVER THE COUNTER MEDICATION      TAKE these medications       amLODipine 5 MG tablet  Commonly known as:  NORVASC  Take 5 mg by mouth daily.     cholecalciferol 1000 UNITS  tablet  Commonly known as:  VITAMIN D  Take 1,000 Units by mouth daily.     doxycycline 100 MG capsule  Commonly known as:  VIBRAMYCIN  Take 1 capsule (100 mg total) by mouth 2 (two) times daily.     gabapentin 600 MG tablet  Commonly known as:  NEURONTIN  Take 600 mg by mouth 2 (two) times daily.     labetalol 200 MG tablet  Commonly known as:  NORMODYNE  Take 200 mg by mouth 2 (two) times daily.     losartan 100 MG tablet  Commonly known as:  COZAAR  Take 100 mg by mouth daily.     phenytoin 100 MG ER capsule  Commonly known as:  DILANTIN  Take 1 capsule (100 mg total) by  mouth 3 (three) times daily.     primidone 250 MG tablet  Commonly known as:  MYSOLINE  Take 250 mg by mouth 2 (two) times daily.     raloxifene 60 MG tablet  Commonly known as:  EVISTA  Take 60 mg by mouth daily.     vitamin B-12 1000 MCG tablet  Commonly known as:  CYANOCOBALAMIN  Take 1,000 mcg by mouth daily.       No Known Allergies    The results of significant diagnostics from this hospitalization (including imaging, microbiology, ancillary and laboratory) are listed below for reference.    Significant Diagnostic Studies: Ct Head Wo Contrast  05/25/2013   CLINICAL DATA:  Code stroke. Slurred speech. Question seizure. History of traumatic brain injury as a child.  EXAM: CT HEAD WITHOUT CONTRAST  TECHNIQUE: Contiguous axial images were obtained from the base of the skull through the vertex without intravenous contrast.  COMPARISON:  None.  FINDINGS: Chronic encephalomalacia is present in the anterior left frontal lobe. There is a calvarial defect over this area which appears somewhat expanded. Ex vacuo dilation is noted in the left lateral ventricle. Moderate generalized atrophy and diffuse white matter disease is present bilaterally.  No acute cortical infarct, hemorrhage, or mass lesion is present. The ventricles are of normal size. No significant extra-axial fluid collection is present otherwise.  The paranasal sinuses and mastoid air cells are clear. The osseous skull is otherwise intact.  IMPRESSION: 1. No acute intracranial abnormality. 2. Chronic left frontal lobe encephalomalacia. 3. Moderate generalized white matter disease. This likely reflects the sequela of chronic microvascular ischemia. 4. The left frontal calvarial defect. This appears postsurgical of may be expanded. 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 Comparison with previous imaging would be useful. These results were called by telephone at the time of interpretation on 05/25/2013 at 4:29 PM to Dr. Ritta Slot , who verbally acknowledged these results.   Electronically Signed   By: Gennette Pac M.D.   On: 05/25/2013 16:29   Ct Chest W Contrast  05/25/2013   CLINICAL DATA:  Right hilar mass noted on the current chest radiograph.  EXAM: CT CHEST WITH CONTRAST  TECHNIQUE: Multidetector CT imaging of the chest was performed during intravenous contrast administration.  CONTRAST:  75mL OMNIPAQUE IOHEXOL 300 MG/ML  SOLN  COMPARISON:  Chest radiograph, 05/25/2013  FINDINGS: There is focal consolidation in that extends from the superior segment of the right lower lobe into the adjacent posterior right upper lobe. There are air bronchograms within this is well small foci of air. This is most consistent with an infectious or inflammatory infiltrate.  There is linear and reticular atelectasis in the lower lobes. Changes of mild  centrilobular emphysema are noted in the upper lobes. No pleural effusion.  Heart is mildly enlarged. There is dilation of the right pulmonary artery to 29 mm. The aorta is unremarkable. No mediastinal masses or pathologically enlarged lymph nodes noted. An 8 mm hypoattenuating nodule lies in the posterior right thyroid lobe. There is mild right hilar adenopathy. No left hilar adenopathy.  Limited evaluation of the upper abdomen is unremarkable.  Degenerative changes are noted of the visualized spine. No osteoblastic or osteolytic lesions.  IMPRESSION: 1. Masslike opacity seen on the current chest radiograph is an area of focal consolidation, most likely pneumonia, which lies in the superior segment of the right lower lobe that extends to the adjacent posterior right upper lobe. There is mild right hilar adenopathy is associated with this, presumed to be reactive. 2. No other acute findings. 3. Recommend followup chest radiographs in 3-4 weeks following treatment to document improvement/ resolution.   Electronically Signed   By: Amie Portland M.D.   On: 05/25/2013 20:21   Dg Chest Portable  1 View  05/25/2013   CLINICAL DATA:  Code stroke patient.  Slurred speech.  EXAM: PORTABLE CHEST - 1 VIEW  COMPARISON:  None.  FINDINGS: The patient has a right hilar mass measuring 5.9 cm craniocaudal by 4.5 cm transverse. The lungs appear emphysematous. No pneumothorax identified. Skin folds over the upper chest are noted. Heart size is upper normal.  IMPRESSION: Right hilar mass lesion. CT chest with contrast recommended for further evaluation.   Electronically Signed   By: Drusilla Kanner M.D.   On: 05/25/2013 17:13    Microbiology: Recent Results (from the past 240 hour(s))  CULTURE, BLOOD (ROUTINE X 2)     Status: None   Collection Time    05/25/13  9:50 PM      Result Value Range Status   Specimen Description BLOOD LEFT ARM   Final   Special Requests BOTTLES DRAWN AEROBIC ONLY 10CC   Final   Culture  Setup Time     Final   Value: 05/26/2013 12:50     Performed at [facility]   Culture     Final   Value:        BLOOD CULTURE RECEIVED NO GROWTH TO DATE CULTURE WILL BE HELD FOR 5 DAYS BEFORE ISSUING A FINAL NEGATIVE REPORT     Performed at [facility]   Report Status PENDING   Incomplete  CULTURE, BLOOD (ROUTINE X 2)     Status: None   Collection Time    05/25/13 10:00 PM      Result Value Range Status   Specimen Description BLOOD LEFT ARM   Final   Special Requests BOTTLES DRAWN AEROBIC ONLY 7CC   Final   Culture  Setup Time     Final   Value: 05/26/2013 12:50     Performed at [facility]   Culture     Final   Value:        BLOOD CULTURE RECEIVED NO GROWTH TO DATE CULTURE WILL BE HELD FOR 5 DAYS BEFORE ISSUING A FINAL NEGATIVE REPORT     Performed at [facility]   Report Status PENDING   Incomplete     Labs: Basic Metabolic Panel:  Recent Labs Lab 05/25/13 1608 05/25/13 1623 05/26/13 0700 05/28/13 0900  NA 134* 136 138 140  K 3.5 3.6 3.4* 3.5  CL 96 100 102 104  CO2 25  --  23 26  GLUCOSE 111*  114* 120* 172*  BUN 16 17 15 8    CREATININE 0.66 0.70 0.88 0.67  CALCIUM 9.0  --  8.0* 8.2*   Liver Function Tests:  Recent Labs Lab 05/25/13 1608 05/26/13 0700  AST 26 27  ALT 24 23  ALKPHOS 149* 111  BILITOT 0.4 0.6  PROT 7.4 6.2  ALBUMIN 4.1 3.2*   No results found for this basename: LIPASE, AMYLASE,  in the last 168 hours No results found for this basename: AMMONIA,  in the last 168 hours CBC:  Recent Labs Lab 05/25/13 1608 05/25/13 1623 05/26/13 0700 05/28/13 0900  WBC 9.1  --  11.0* 6.1  NEUTROABS 7.6  --   --   --   HGB 12.9 14.6 11.4* 11.7*  HCT 37.3 43.0 34.0* 35.3*  MCV 96.6  --  97.7 98.6  PLT 136*  --  135* 134*   Cardiac Enzymes:  Recent Labs Lab 05/25/13 1608  TROPONINI <0.30   BNP: BNP (last 3 results) No results found for this basename: PROBNP,  in the last 8760 hours CBG:  Recent Labs Lab 05/25/13 1634  GLUCAP 111*       Signed:  Tomika Eckles  [facility] 05/29/2013, 9:36 AM

## 2013-05-29 NOTE — Progress Notes (Signed)
Physical Therapy Treatment Patient Details Name: Lisa Eaton MRN: [id number] DOB: [birth year] Today's Date: 05/29/2013 Time: 4098-1191 PT Time Calculation (min): 27 min  PT Assessment / Plan / Recommendation  History of Present Illness Lisa Eaton is a 79 y.o. female with PMH significant for seizure disorder, brain trauma and brain surgery, chronic right side weakness who was found on the floor by husban. Patient was notice to be confuse, incontinent of urine, lethargic at that time. At 9;30 am patient took a nap. She was still sleeping at 13;00. Around 15;30 she was found on the floor by husband. Patient was notice to be febrile at that time at 101. She was initially a CODE stroke but it was cancelled.    PT Comments   Pt much improved mobility today.  Able to ambulate without AD and much improved balance.    Follow Up Recommendations  Outpatient PT;Other (comment) (back to Outpatient Therapies in [place])     Does the patient have the potential to tolerate intense rehabilitation     Barriers to Discharge        Equipment Recommendations  None recommended by PT    Recommendations for Other Services    Frequency Min 3X/week   Progress towards PT Goals Progress towards PT goals: Progressing toward goals  Plan Current plan remains appropriate    Precautions / Restrictions Precautions Precautions: Fall Restrictions Weight Bearing Restrictions: No   Pertinent Vitals/Pain Denied pain.      Mobility  Bed Mobility Bed Mobility: Not assessed Transfers Transfers: Sit to Stand;Stand to Sit Sit to Stand: 6: Modified independent (Device/Increase time);With upper extremity assist;From chair/3-in-1 Stand to Sit: 6: Modified independent (Device/Increase time);With upper extremity assist;To chair/3-in-1 Details for Transfer Assistance: pt utilizes UEs, but performs safely.   Ambulation/Gait Ambulation/Gait Assistance: 5: Supervision Ambulation Distance (Feet): 500 Feet Assistive  device: None Ambulation/Gait Assistance Details: pt gait much improved.  continues with R foot drop, but much improved balance.  No AD needed today.   Gait Pattern: Step-through pattern;Decreased stride length;Decreased dorsiflexion - right Stairs: No Wheelchair Mobility Wheelchair Mobility: No    Exercises     PT Diagnosis:    PT Problem List:   PT Treatment Interventions:     PT Goals (current goals can now be found in the care plan section) Acute Rehab PT Goals Patient Stated Goal: home Time For Goal Achievement: 06/03/13 Potential to Achieve Goals: Good  Visit Information  Last PT Received On: 05/29/13 Assistance Needed: +1 History of Present Illness: Lisa Eaton is a 79 y.o. female with PMH significant for seizure disorder, brain trauma and brain surgery, chronic right side weakness who was found on the floor by husban. Patient was notice to be confuse, incontinent of urine, lethargic at that time. At 9;30 am patient took a nap. She was still sleeping at 13;00. Around 15;30 she was found on the floor by husband. Patient was notice to be febrile at that time at 101. She was initially a CODE stroke but it was cancelled.     Subjective Data  Patient Stated Goal: home   Cognition  Cognition Arousal/Alertness: Awake/alert Behavior During Therapy: WFL for tasks assessed/performed Overall Cognitive Status: History of cognitive impairments - at baseline Memory: Decreased short-term memory    Balance  Balance Balance Assessed: No  End of Session PT - End of Session Equipment Utilized During Treatment: Gait belt Activity Tolerance: Patient tolerated treatment well Patient left: in chair;with call bell/phone within reach Nurse Communication: Mobility status   GP  Lisa Eaton, [place] [phone] 05/29/2013, 11:54 AM

## 2013-06-01 LAB — CULTURE, BLOOD (ROUTINE X 2)
Culture: NO GROWTH
Culture: NO GROWTH

## 2015-11-04 ENCOUNTER — Other Ambulatory Visit: Payer: Self-pay | Admitting: Neurology

## 2015-11-04 DIAGNOSIS — R569 Unspecified convulsions: Secondary | ICD-10-CM

## 2015-11-14 ENCOUNTER — Other Ambulatory Visit
Admission: RE | Admit: 2015-11-14 | Discharge: 2015-11-14 | Disposition: A | Discharge status: Home / Self Care | Payer: Medicare Other | Source: Ambulatory Visit | Attending: Internal Medicine | Admitting: Internal Medicine

## 2015-11-14 DIAGNOSIS — G40909 Epilepsy, unspecified, not intractable, without status epilepticus: Secondary | ICD-10-CM | POA: Insufficient documentation

## 2015-11-14 LAB — PHENYTOIN LEVEL, TOTAL: PHENYTOIN LVL: 4.9 ug/mL — AB (ref 10.0–20.0)

## 2015-11-21 ENCOUNTER — Emergency Department
Admission: EM | Admit: 2015-11-21 | Discharge: 2015-11-21 | Disposition: A | Discharge status: Home / Self Care | Payer: MEDICARE | Attending: Emergency Medicine | Admitting: Emergency Medicine

## 2015-11-21 ENCOUNTER — Emergency Department: Payer: MEDICARE

## 2015-11-21 ENCOUNTER — Other Ambulatory Visit: Payer: Self-pay

## 2015-11-21 ENCOUNTER — Encounter: Payer: Self-pay | Admitting: Emergency Medicine

## 2015-11-21 DIAGNOSIS — I10 Essential (primary) hypertension: Secondary | ICD-10-CM | POA: Diagnosis not present

## 2015-11-21 DIAGNOSIS — W1839XA Other fall on same level, initial encounter: Secondary | ICD-10-CM | POA: Diagnosis not present

## 2015-11-21 DIAGNOSIS — S0990XA Unspecified injury of head, initial encounter: Secondary | ICD-10-CM | POA: Insufficient documentation

## 2015-11-21 DIAGNOSIS — Y999 Unspecified external cause status: Secondary | ICD-10-CM | POA: Diagnosis not present

## 2015-11-21 DIAGNOSIS — Y929 Unspecified place or not applicable: Secondary | ICD-10-CM | POA: Diagnosis not present

## 2015-11-21 DIAGNOSIS — Y939 Activity, unspecified: Secondary | ICD-10-CM | POA: Diagnosis not present

## 2015-11-21 DIAGNOSIS — Z8669 Personal history of other diseases of the nervous system and sense organs: Secondary | ICD-10-CM | POA: Insufficient documentation

## 2015-11-21 DIAGNOSIS — Z792 Long term (current) use of antibiotics: Secondary | ICD-10-CM | POA: Insufficient documentation

## 2015-11-21 DIAGNOSIS — Z79899 Other long term (current) drug therapy: Secondary | ICD-10-CM | POA: Diagnosis not present

## 2015-11-21 DIAGNOSIS — W19XXXA Unspecified fall, initial encounter: Secondary | ICD-10-CM

## 2015-11-21 NOTE — ED Provider Notes (Signed)
[facility] Emergency Department Provider Note  Time seen: 6:20 PM  I have reviewed the triage vital signs and the nursing notes.   HISTORY  Chief Complaint Fall and Head Injury    HPI Lisa Eaton is a 82 y.o. female with a past medical history of hypertensionwho presents to the emergency department for a fall. According to the patient and her husband she has fairly frequent falls due to some right-sided weakness at baseline. She states she had a fall tonight hitting the back of her head. Denies LOC. Denies weakness or numbness. Denies headache or confusion. Patient denies use of blood thinners.     Past Medical History  Diagnosis Date  . Hypertension   . Pneumonia     Patient Active Problem List   Diagnosis Date Noted  . CAP (community acquired pneumonia) 05/26/2013  . Convulsions/seizures ([place]) 05/26/2013  . Hypokalemia 05/26/2013  . Altered mental status 05/25/2013  . Encephalopathy 05/25/2013    History reviewed. No pertinent past surgical history.  Current Outpatient Rx  Name  Route  Sig  Dispense  Refill  . amLODipine (NORVASC) 5 MG tablet   Oral   Take 5 mg by mouth daily.         . cholecalciferol (VITAMIN D) 1000 UNITS tablet   Oral   Take 1,000 Units by mouth daily.         Marland Kitchen doxycycline (VIBRAMYCIN) 100 MG capsule   Oral   Take 1 capsule (100 mg total) by mouth 2 (two) times daily.   10 capsule   0   . gabapentin (NEURONTIN) 600 MG tablet   Oral   Take 600 mg by mouth 2 (two) times daily.         Marland Kitchen labetalol (NORMODYNE) 200 MG tablet   Oral   Take 200 mg by mouth 2 (two) times daily.         Marland Kitchen losartan (COZAAR) 100 MG tablet   Oral   Take 100 mg by mouth daily.         . phenytoin (DILANTIN) 100 MG ER capsule   Oral   Take 1 capsule (100 mg total) by mouth 3 (three) times daily.   90 capsule   0   . primidone (MYSOLINE) 250 MG tablet   Oral   Take 250 mg by mouth 2 (two) times daily.         .  raloxifene (EVISTA) 60 MG tablet   Oral   Take 60 mg by mouth daily.         . vitamin B-12 (CYANOCOBALAMIN) 1000 MCG tablet   Oral   Take 1,000 mcg by mouth daily.           Allergies Penicillins  No family history on file.  Social History Social History  Substance Use Topics  . Smoking status: Never Smoker   . Smokeless tobacco: None  . Alcohol Use: 0.6 oz/week    1 Glasses of wine per week    Review of Systems Constitutional: Negative for fever. Cardiovascular: Negative for chest pain. Respiratory: Negative for shortness of breath. Gastrointestinal: Negative for abdominal pain Genitourinary: Negative for dysuria.Negative for hematuria. Neurological: Negative for headache. Denies any new weakness or numbness. 10-point ROS otherwise negative.  ____________________________________________   PHYSICAL EXAM:  VITAL SIGNS: ED Triage Vitals  Enc Vitals Group     BP 11/21/15 1704 207/101 mmHg     Pulse Rate 11/21/15 1704 79     Resp 11/21/15 1704  18     Temp 11/21/15 1704 97.7 F (36.5 C)     Temp Source 11/21/15 1704 Oral     SpO2 11/21/15 1704 97 %     Weight 11/21/15 1704 115 lb (52.164 kg)     Height 11/21/15 1704 5\' 4"  (1.626 m)     Head Cir --      Peak Flow --      Pain Score 11/21/15 1705 8     Pain Loc --      Pain Edu? --      Excl. in [place]? --     Constitutional: Alert and oriented. Well appearing and in no distress. Eyes: Normal exam ENT   Head: Moderate size left occipital hematoma   Mouth/Throat: Mucous membranes are moist. Cardiovascular: Normal rate, regular rhythm. Respiratory: Normal respiratory effort without tachypnea nor retractions. Breath sounds are clear Gastrointestinal: Soft and nontender. No distention.   Musculoskeletal: Nontender with normal range of motion in all extremities. Neurologic:  Normal speech and language. Mild right upper extremity paralysis, unchanged per patient. Skin:  Skin is warm, dry and intact.   Psychiatric: Mood and affect are normal.   ____________________________________________   EKG reviewed and interpreted by myself shows normal sinus rhythm at 74 bpm, narrow QRS, normal axis, lives in normal intervals with nonspecific but no concerning ST changes.    RADIOLOGY  CT shows scalp hematoma, no intracranial abnormality   INITIAL IMPRESSION / ASSESSMENT AND PLAN / ED COURSE  Pertinent labs & imaging results that were available during my care of the patient were reviewed by me and considered in my medical decision making (see chart for details).  Patient does seem to partner after a fall with a large occipital hematoma. No C-spine tenderness. No back tenderness. Patient is ambulated without difficulty. Patient has a history of partial right-sided paralysis, but does not use a walker. I discussed these walker with the patient. CT is negative. We'll discharge the patient home after ambulating in the emergency department.  ____________________________________________   FINAL CLINICAL IMPRESSION(S) / ED DIAGNOSES  Fall Closed head injury   Harvest Dark, MD 11/21/15 [id number]

## 2015-11-21 NOTE — ED Notes (Signed)
Denies blood thinners, only c/o headache.

## 2015-11-21 NOTE — Discharge Instructions (Signed)
Head Injury, Adult °You have a head injury. Headaches and throwing up (vomiting) are common after a head injury. It should be easy to wake up from sleeping. Sometimes you must stay in the hospital. Most problems happen within the first 24 hours. Side effects may occur up to 7-10 days after the injury.  °WHAT ARE THE TYPES OF HEAD INJURIES? °Head injuries can be as minor as a bump. Some head injuries can be more severe. More severe head injuries include: °· A jarring injury to the brain (concussion). °· A bruise of the brain (contusion). This mean there is bleeding in the brain that can cause swelling. °· A cracked skull (skull fracture). °· Bleeding in the brain that collects, clots, and forms a bump (hematoma). °WHEN SHOULD I GET HELP RIGHT AWAY?  °· You are confused or sleepy. °· You cannot be woken up. °· You feel sick to your stomach (nauseous) or keep throwing up (vomiting). °· Your dizziness or unsteadiness is getting worse. °· You have very bad, lasting headaches that are not helped by medicine. Take medicines only as told by your doctor. °· You cannot use your arms or legs like normal. °· You cannot walk. °· You notice changes in the black spots in the center of the colored part of your eye (pupil). °· You have clear or bloody fluid coming from your nose or ears. °· You have trouble seeing. °During the next 24 hours after the injury, you must stay with someone who can watch you. This person should get help right away (call 911 in the [place] if you start to shake and are not able to control it (have seizures), you pass out, or you are unable to wake up. °HOW CAN I PREVENT A HEAD INJURY IN THE FUTURE? °· Wear seat belts. °· Wear a helmet while bike riding and playing sports like football. °· Stay away from dangerous activities around the house. °WHEN CAN I RETURN TO NORMAL ACTIVITIES AND ATHLETICS? °See your doctor before doing these activities. You should not do normal activities or play contact sports until 1  week after the following symptoms have stopped: °· Headache that does not go away. °· Dizziness. °· Poor attention. °· Confusion. °· Memory problems. °· Sickness to your stomach or throwing up. °· Tiredness. °· Fussiness. °· Bothered by bright lights or loud noises. °· Anxiousness or depression. °· Restless sleep. °MAKE SURE YOU:  °· Understand these instructions. °· Will watch your condition. °· Will get help right away if you are not doing well or get worse. °  °This information is not intended to replace advice given to you by your health care provider. Make sure you discuss any questions you have with your health care provider. °  °Document Released: 04/29/2008 Document Revised: 06/07/2014 Document Reviewed: 01/22/2013 °Elsevier Interactive Patient Education ©2016 [employer] ° °

## 2015-11-21 NOTE — ED Notes (Signed)
Pt lost her footing and fell backwards landing on her head today, states no LOC, lac noted to back of head, bleeding controlled. Knot noted to back of head as well. Pt alert and oriented.

## 2015-11-21 NOTE — ED Notes (Signed)
While triaging pt, some irregular beats noted with pulse, EKG obtained.

## 2015-11-25 ENCOUNTER — Ambulatory Visit
Admission: RE | Admit: 2015-11-25 | Discharge: 2015-11-25 | Disposition: A | Discharge status: Home / Self Care | Payer: MEDICARE | Source: Ambulatory Visit | Attending: Neurology | Admitting: Neurology

## 2015-11-25 DIAGNOSIS — Z87820 Personal history of traumatic brain injury: Secondary | ICD-10-CM | POA: Diagnosis not present

## 2015-11-25 DIAGNOSIS — R569 Unspecified convulsions: Secondary | ICD-10-CM | POA: Diagnosis present

## 2015-11-25 DIAGNOSIS — G9389 Other specified disorders of brain: Secondary | ICD-10-CM | POA: Diagnosis not present

## 2015-11-25 DIAGNOSIS — G930 Cerebral cysts: Secondary | ICD-10-CM | POA: Diagnosis not present

## 2015-11-25 DIAGNOSIS — S0291XS Unspecified fracture of skull, sequela: Secondary | ICD-10-CM | POA: Insufficient documentation

## 2015-11-25 DIAGNOSIS — X58XXXS Exposure to other specified factors, sequela: Secondary | ICD-10-CM | POA: Diagnosis not present

## 2016-01-23 ENCOUNTER — Emergency Department: Payer: MEDICARE

## 2016-01-23 ENCOUNTER — Emergency Department
Admission: EM | Admit: 2016-01-23 | Discharge: 2016-01-23 | Disposition: A | Discharge status: Home / Self Care | Payer: MEDICARE | Attending: Emergency Medicine | Admitting: Emergency Medicine

## 2016-01-23 ENCOUNTER — Encounter: Payer: Self-pay | Admitting: Emergency Medicine

## 2016-01-23 DIAGNOSIS — Z792 Long term (current) use of antibiotics: Secondary | ICD-10-CM | POA: Insufficient documentation

## 2016-01-23 DIAGNOSIS — Z79899 Other long term (current) drug therapy: Secondary | ICD-10-CM | POA: Insufficient documentation

## 2016-01-23 DIAGNOSIS — IMO0001 Reserved for inherently not codable concepts without codable children: Secondary | ICD-10-CM

## 2016-01-23 DIAGNOSIS — M25471 Effusion, right ankle: Secondary | ICD-10-CM | POA: Diagnosis not present

## 2016-01-23 DIAGNOSIS — R030 Elevated blood-pressure reading, without diagnosis of hypertension: Secondary | ICD-10-CM

## 2016-01-23 DIAGNOSIS — M7989 Other specified soft tissue disorders: Secondary | ICD-10-CM | POA: Diagnosis present

## 2016-01-23 DIAGNOSIS — I10 Essential (primary) hypertension: Secondary | ICD-10-CM | POA: Insufficient documentation

## 2016-01-23 LAB — CBC WITH DIFFERENTIAL/PLATELET
BASOS PCT: 1 %
Basophils Absolute: 0 10*3/uL (ref 0–0.1)
EOS ABS: 0.2 10*3/uL (ref 0–0.7)
EOS PCT: 3 %
HCT: 37.5 % (ref 35.0–47.0)
Hemoglobin: 13.3 g/dL (ref 12.0–16.0)
Lymphocytes Relative: 26 %
Lymphs Abs: 1.4 10*3/uL (ref 1.0–3.6)
MCH: 33.4 pg (ref 26.0–34.0)
MCHC: 35.4 g/dL (ref 32.0–36.0)
MCV: 94.4 fL (ref 80.0–100.0)
MONO ABS: 0.7 10*3/uL (ref 0.2–0.9)
MONOS PCT: 13 %
Neutro Abs: 2.9 10*3/uL (ref 1.4–6.5)
Neutrophils Relative %: 57 %
Platelets: 207 10*3/uL (ref 150–440)
RBC: 3.97 MIL/uL (ref 3.80–5.20)
RDW: 13.6 % (ref 11.5–14.5)
WBC: 5.2 10*3/uL (ref 3.6–11.0)

## 2016-01-23 LAB — COMPREHENSIVE METABOLIC PANEL
ALBUMIN: 4.5 g/dL (ref 3.5–5.0)
ALK PHOS: 125 U/L (ref 38–126)
ALT: 25 U/L (ref 14–54)
ANION GAP: 4 — AB (ref 5–15)
AST: 24 U/L (ref 15–41)
BILIRUBIN TOTAL: 0.6 mg/dL (ref 0.3–1.2)
BUN: 19 mg/dL (ref 6–20)
CO2: 28 mmol/L (ref 22–32)
CREATININE: 0.64 mg/dL (ref 0.44–1.00)
Calcium: 9.2 mg/dL (ref 8.9–10.3)
Chloride: 107 mmol/L (ref 101–111)
GFR calc non Af Amer: >60 mL/min (ref >60)
Glucose, Bld: 77 mg/dL (ref 65–99)
Potassium: 4.5 mmol/L (ref 3.5–5.1)
SODIUM: 139 mmol/L (ref 135–145)
TOTAL PROTEIN: 7.3 g/dL (ref 6.5–8.1)

## 2016-01-23 MED ORDER — AMLODIPINE BESYLATE 5 MG PO TABS
5.0000 mg | ORAL_TABLET | Freq: Once | ORAL | Status: AC
  Administered 2016-01-23: 5 mg via ORAL
  Filled 2016-01-23: qty 1

## 2016-01-23 NOTE — ED Triage Notes (Signed)
C/O right foot swelling x 1 week.  Denies injury.  +1 swelling seen to right ankle and foot.  Denies pain.

## 2016-01-23 NOTE — ED Notes (Signed)
Pt returned from US

## 2016-01-23 NOTE — ED Notes (Signed)
Pt is here with concern of swelling in her right foot. Pt has +2 edema in that foot. Pt has palpable pulse in the right pedal. Pt has no c/o pain. Pt stating that she is just concerned because "it keeps swelling up." Pt denies any injury to foot or leg.

## 2016-01-23 NOTE — Discharge Instructions (Signed)
It is unclear why you have swelling to your  ankle. It does appear that you have injured it somehow. Tests are negative for fracture, as well as for blood clot. I would advise elevation, and if you can take them, Motrin or ibuprofen on a low dose as needed for pain. Follow closely with your  primary care doctor and with orthopedic surgery. If there is increased pain swelling redness fever or other complaints return to the emergency department. We also notice that your  blood pressure is elevated today. This may be because you  did not take your  medication this morning. We strongly advise that you continue taking medication as prescribed, and that you follow closely with your primary care doctor tomorrow for a recheck. If  there is a Chest pain shortness of breath or any other new or worrisome symptoms return to the emergency room.Lisa Eaton

## 2016-01-23 NOTE — ED Notes (Addendum)
Patient transported to US 

## 2016-01-23 NOTE — ED Notes (Signed)
Pt escorted to restroom.

## 2016-01-23 NOTE — ED Notes (Signed)
Patient transported to Ultrasound 

## 2016-01-23 NOTE — ED Provider Notes (Addendum)
[facility] Emergency Department Provider Note  ____________________________________________   I have reviewed the triage vital signs and the nursing notes.   HISTORY  Chief Complaint Foot Swelling    HPI Lisa Eaton is a 82 y.o. female who presents today with left medial ankle swelling for one week. She states it's a little bit uncomfortable to walk on. She has no recollected injury to the extremity. She denies any fever or chills. She has no chest pain or shortness of breath. She has no other legs 1. She has no history of PE or DVT. She states she's noticed that for 1 week and she thought it would be time to get checked out. Patient does not recall what medications she is on but she states she did not take her blood pressure medication today.     Past Medical History:  Diagnosis Date  . Hypertension   . Pneumonia     Patient Active Problem List   Diagnosis Date Noted  . CAP (community acquired pneumonia) 05/26/2013  . Convulsions/seizures ([place]) 05/26/2013  . Hypokalemia 05/26/2013  . Altered mental status 05/25/2013  . Encephalopathy 05/25/2013    History reviewed. No pertinent surgical history.  Prior to Admission medications   Medication Sig Start Date End Date Taking? Authorizing Provider  amLODipine (NORVASC) 5 MG tablet Take 5 mg by mouth daily.    Historical Provider, MD  cholecalciferol (VITAMIN D) 1000 UNITS tablet Take 1,000 Units by mouth daily.    Historical Provider, MD  doxycycline (VIBRAMYCIN) 100 MG capsule Take 1 capsule (100 mg total) by mouth 2 (two) times daily. 05/29/13   Geradine Girt, DO  gabapentin (NEURONTIN) 600 MG tablet Take 600 mg by mouth 2 (two) times daily.    Historical Provider, MD  labetalol (NORMODYNE) 200 MG tablet Take 200 mg by mouth 2 (two) times daily.    Historical Provider, MD  losartan (COZAAR) 100 MG tablet Take 100 mg by mouth daily.    Historical Provider, MD  phenytoin (DILANTIN) 100 MG ER capsule  Take 1 capsule (100 mg total) by mouth 3 (three) times daily. 05/29/13   Geradine Girt, DO  primidone (MYSOLINE) 250 MG tablet Take 250 mg by mouth 2 (two) times daily.    Historical Provider, MD  raloxifene (EVISTA) 60 MG tablet Take 60 mg by mouth daily.    Historical Provider, MD  vitamin B-12 (CYANOCOBALAMIN) 1000 MCG tablet Take 1,000 mcg by mouth daily.    Historical Provider, MD    Allergies Penicillins  No family history on file.  Social History Social History  Substance Use Topics  . Smoking status: Never Smoker  . Smokeless tobacco: Never Used  . Alcohol use 0.6 oz/week    1 Glasses of wine per week    Review of Systems Constitutional: No fever/chills Eyes: No visual changes. ENT: No sore throat. No stiff neck no neck pain Cardiovascular: Denies chest pain. Respiratory: Denies shortness of breath. Gastrointestinal:   no vomiting.  No diarrhea.  No constipation. Genitourinary: Negative for dysuria. Musculoskeletal:See history of present illness Skin: Negative for rash. Neurological: Negative for severe headaches, focal weakness or numbness. 10-point ROS otherwise negative.  ____________________________________________   PHYSICAL EXAM:  VITAL SIGNS: ED Triage Vitals  Enc Vitals Group     BP 01/23/16 1116 (!) 187/79     Pulse Rate 01/23/16 1116 (!) 55     Resp 01/23/16 1116 18     Temp 01/23/16 1116 97.7 F (36.5 C)  Temp Source 01/23/16 1116 Oral     SpO2 01/23/16 1116 96 %     Weight 01/23/16 1119 120 lb (54.4 kg)     Height 01/23/16 1119 5\' 5"  (1.651 m)     Head Circumference --      Peak Flow --      Pain Score 01/23/16 1119 0     Pain Loc --      Pain Edu? --      Excl. in [place]? --     Constitutional: Alert and oriented. Well appearing and in no acute distress. Eyes: Conjunctivae are normal. PERRL. EOMI. Head: Atraumatic. Nose: No congestion/rhinnorhea. Mouth/Throat: Mucous membranes are moist.  Oropharynx non-erythematous. Neck: No  stridor.   Nontender with no meningismus Cardiovascular: Normal rate, regular rhythm. Grossly normal heart sounds.  Good peripheral circulation. Respiratory: Normal respiratory effort.  No retractions. Lungs CTAB. Abdominal: Soft and nontender. No distention. No guarding no rebound Back:  There is no focal tenderness or step off.  there is no midline tenderness there are no lesions noted. there is no CVA tenderness Musculoskeletal: An ankle shows minimal swelling around the medial malleolus with mild point  tenderness in that area. There is no bruising, there are strong pulses compartments are soft there is no calf pain or swelling, negative Homans sign, no other abnormality noted to the lower leg. Otherwise, No lower extremity tenderness, no upper extremity tenderness. No joint effusions, no DVT signs strong distal pulses no edema Neurologic:  Normal speech and language. No gross focal neurologic deficits are appreciated.  Skin:  Skin is warm, dry and intact. No rash noted. Psychiatric: Mood and affect are normal. Speech and behavior are normal.  ____________________________________________   LABS (all labs ordered are listed, but only abnormal results are displayed)  Labs Reviewed  COMPREHENSIVE METABOLIC PANEL - Abnormal; Notable for the following:       Result Value   Anion gap 4 (*)    All other components within normal limits  CBC WITH DIFFERENTIAL/PLATELET   ____________________________________________  EKG  I personally interpreted any EKGs ordered by me or triage  ____________________________________________  RADIOLOGY  I reviewed any imaging ordered by me or triage that were performed during my shift and, if possible, patient and/or family made aware of any abnormal findings. ____________________________________________   PROCEDURES  Procedure(s) performed: None  Procedures  Critical Care performed: None  ____________________________________________   INITIAL  IMPRESSION / ASSESSMENT AND PLAN / ED COURSE  Pertinent labs & imaging results that were available during my care of the patient were reviewed by me and considered in my medical decision making (see chart for details).  Patient with swelling to her right lower extremity of 1 week's duration. There is also slight tenderness right around the medial malleolus. This is most likely a non-recollected traumatic injury. However, we will obtain ultrasound to rule out DVT although again very unlikely we will x-ray the area as well. That is not red is not hot to touch and I do not suspect the patient has a septic joint. No evidence of compartment syndrome. No evidence of acute vascular compromise. She has strong pulses. Good cap refill.  ----------------------------------------- 5:06 PM on 01/23/2016 -----------------------------------------  ASIS blood pressure was elevated, however this is an incidental finding. She has no evidence of CHF chest pain shows breath renal injury or other acute pathology associated with a essentially non-symptomatically hypertension related to noncompliance with her medications. Patient states she did not take her blood pressure medication since  migrated to give her her Norvasc here. I anticipate that that blood pressures trending down. She will follow-up with her doctor tomorrow. The reason she is here is because of medial ankle swelling which is not tender unless I deeply palpated. There is again no evidence of infection, there is no DVT noted, and I have very low suspicion*with nor is there any evidence of fracture. This is most likely muscle skeletal issue that will require close outpatient follow-up. Return precautions follow-up given and understood. As patient is able to family without difficulty, we will avoid crutches as it would increase the risk of a significant fall in a patient her age.  Clinical Course   ____________________________________________   FINAL CLINICAL  IMPRESSION(S) / ED DIAGNOSES  Final diagnoses:  None      This chart was dictated using voice recognition software.  Despite best efforts to proofread,  errors can occur which can change meaning.      Schuyler Amor, MD 01/23/16 1538    Schuyler Amor, MD 01/23/16 [phone]

## 2016-10-21 ENCOUNTER — Encounter: Payer: Self-pay | Admitting: *Deleted

## 2016-10-28 ENCOUNTER — Encounter: Payer: Self-pay | Admitting: Anesthesiology

## 2016-10-28 ENCOUNTER — Ambulatory Visit: Payer: MEDICARE | Admitting: Anesthesiology

## 2016-10-28 ENCOUNTER — Ambulatory Visit
Admission: RE | Admit: 2016-10-28 | Discharge: 2016-10-28 | Disposition: A | Discharge status: Home / Self Care | Payer: MEDICARE | Source: Ambulatory Visit | Attending: Ophthalmology | Admitting: Ophthalmology

## 2016-10-28 ENCOUNTER — Encounter
Admission: RE | Disposition: A | Discharge status: Home / Self Care | Payer: Self-pay | Source: Ambulatory Visit | Attending: Ophthalmology

## 2016-10-28 DIAGNOSIS — Z79899 Other long term (current) drug therapy: Secondary | ICD-10-CM | POA: Diagnosis not present

## 2016-10-28 DIAGNOSIS — I10 Essential (primary) hypertension: Secondary | ICD-10-CM | POA: Insufficient documentation

## 2016-10-28 DIAGNOSIS — Z880 Allergy status to penicillin: Secondary | ICD-10-CM | POA: Insufficient documentation

## 2016-10-28 DIAGNOSIS — G40909 Epilepsy, unspecified, not intractable, without status epilepticus: Secondary | ICD-10-CM | POA: Insufficient documentation

## 2016-10-28 DIAGNOSIS — H2511 Age-related nuclear cataract, right eye: Secondary | ICD-10-CM | POA: Diagnosis not present

## 2016-10-28 HISTORY — DX: Dizziness and giddiness: R42

## 2016-10-28 HISTORY — DX: Unspecified convulsions: R56.9

## 2016-10-28 HISTORY — DX: Monoplegia of upper limb affecting right dominant side: G83.21

## 2016-10-28 HISTORY — PX: CATARACT EXTRACTION W/PHACO: SHX586

## 2016-10-28 SURGERY — PHACOEMULSIFICATION, CATARACT, WITH IOL INSERTION
Anesthesia: Monitor Anesthesia Care | Site: Eye | Laterality: Right | Wound class: Clean

## 2016-10-28 MED ORDER — ONDANSETRON HCL 4 MG/2ML IJ SOLN: 4.0000 mg | Freq: Once | INTRAMUSCULAR | Status: DC | PRN

## 2016-10-28 MED ORDER — SODIUM HYALURONATE 10 MG/ML IO SOLN
INTRAOCULAR | Status: AC
  Filled 2016-10-28: qty 0.85

## 2016-10-28 MED ORDER — CARBACHOL 0.01 % IO SOLN
INTRAOCULAR | Status: DC | PRN
  Administered 2016-10-28: 0.5 mL via INTRAOCULAR

## 2016-10-28 MED ORDER — MOXIFLOXACIN HCL 0.5 % OP SOLN: 1.0000 [drp] | OPHTHALMIC | Status: DC | PRN

## 2016-10-28 MED ORDER — EPINEPHRINE PF 1 MG/ML IJ SOLN
INTRAOCULAR | Status: DC | PRN
  Administered 2016-10-28: 200 mL via OPHTHALMIC

## 2016-10-28 MED ORDER — TRYPAN BLUE 0.06 % OP SOLN: OPHTHALMIC | Status: DC | PRN

## 2016-10-28 MED ORDER — ARMC OPHTHALMIC DILATING DROPS
OPHTHALMIC | Status: AC
  Filled 2016-10-28: qty 0.4

## 2016-10-28 MED ORDER — FENTANYL CITRATE (PF) 100 MCG/2ML IJ SOLN: 25.0000 ug | INTRAMUSCULAR | Status: DC | PRN

## 2016-10-28 MED ORDER — FENTANYL CITRATE (PF) 100 MCG/2ML IJ SOLN
INTRAMUSCULAR | Status: AC
  Filled 2016-10-28: qty 2

## 2016-10-28 MED ORDER — MOXIFLOXACIN HCL 0.5 % OP SOLN
OPHTHALMIC | Status: DC | PRN
  Administered 2016-10-28: 1 [drp] via OPHTHALMIC

## 2016-10-28 MED ORDER — LIDOCAINE HCL (PF) 4 % IJ SOLN
INTRAOCULAR | Status: DC | PRN
  Administered 2016-10-28: 4 mL via OPHTHALMIC

## 2016-10-28 MED ORDER — EPINEPHRINE PF 1 MG/ML IJ SOLN
INTRAMUSCULAR | Status: AC
  Filled 2016-10-28: qty 2

## 2016-10-28 MED ORDER — SODIUM HYALURONATE 23 MG/ML IO SOLN
INTRAOCULAR | Status: AC
Start: 2016-10-28 — End: 2016-10-28
  Filled 2016-10-28: qty 0.6

## 2016-10-28 MED ORDER — SODIUM HYALURONATE 10 MG/ML IO SOLN
INTRAOCULAR | Status: DC | PRN
  Administered 2016-10-28: 0.85 mL via INTRAOCULAR

## 2016-10-28 MED ORDER — SODIUM CHLORIDE 0.9 % IV SOLN
INTRAVENOUS | Status: DC
  Administered 2016-10-28: 12:00:00 via INTRAVENOUS

## 2016-10-28 MED ORDER — ARMC OPHTHALMIC DILATING DROPS
1.0000 "application " | OPHTHALMIC | Status: AC
  Administered 2016-10-28 (×3): 1 via OPHTHALMIC

## 2016-10-28 MED ORDER — POVIDONE-IODINE 5 % OP SOLN
OPHTHALMIC | Status: AC
  Filled 2016-10-28: qty 30

## 2016-10-28 MED ORDER — MOXIFLOXACIN HCL 0.5 % OP SOLN
OPHTHALMIC | Status: AC
  Filled 2016-10-28: qty 3

## 2016-10-28 MED ORDER — FENTANYL CITRATE (PF) 100 MCG/2ML IJ SOLN
INTRAMUSCULAR | Status: DC | PRN
  Administered 2016-10-28: 25 ug via INTRAVENOUS

## 2016-10-28 MED ORDER — POVIDONE-IODINE 5 % OP SOLN
OPHTHALMIC | Status: DC | PRN
  Administered 2016-10-28: 1 via OPHTHALMIC

## 2016-10-28 MED ORDER — SODIUM HYALURONATE 23 MG/ML IO SOLN
INTRAOCULAR | Status: DC | PRN
  Administered 2016-10-28: 0.6 mL via INTRAOCULAR

## 2016-10-28 SURGICAL SUPPLY — 15 items
DISSECTOR HYDRO NUCLEUS 50X22 (MISCELLANEOUS) ×3 IMPLANT
GLOVE BIO SURGEON STRL SZ8 (GLOVE) ×3 IMPLANT
GLOVE BIOGEL M 6.5 STRL (GLOVE) ×3 IMPLANT
GLOVE SURG LX 7.5 STRW (GLOVE) ×2
GLOVE SURG LX STRL 7.5 STRW (GLOVE) ×1 IMPLANT
GOWN STRL REUS W/ TWL LRG LVL3 (GOWN DISPOSABLE) ×2 IMPLANT
GOWN STRL REUS W/TWL LRG LVL3 (GOWN DISPOSABLE) ×4
LENS IOL TECNIS ITEC 16.5 (Intraocular Lens) ×3 IMPLANT
PACK CATARACT (MISCELLANEOUS) ×3 IMPLANT
PACK CATARACT KING (MISCELLANEOUS) ×3 IMPLANT
PACK EYE AFTER SURG (MISCELLANEOUS) ×3 IMPLANT
SOL BSS BAG (MISCELLANEOUS) ×3
SOLUTION BSS BAG (MISCELLANEOUS) ×1 IMPLANT
WATER STERILE IRR 250ML POUR (IV SOLUTION) ×3 IMPLANT
WIPE NON LINTING 3.25X3.25 (MISCELLANEOUS) ×3 IMPLANT

## 2016-10-28 NOTE — Transfer of Care (Signed)
Immediate Anesthesia Transfer of Care Note  Patient: [name]  Procedure(s) Performed: Procedure(s) with comments: CATARACT EXTRACTION PHACO AND INTRAOCULAR LENS PLACEMENT (IOC) (Right) - [place] 00:34.1 AP% 9.1 CDE 3.13 FLUID LOT # [id number]  Patient Location: PACU and Short Stay  Anesthesia Type:MAC  Level of Consciousness: awake, alert  and oriented  Airway & Oxygen Therapy: Patient Spontanous Breathing  Post-op Assessment: Report given to RN and Post -op Vital signs reviewed and stable  Post vital signs: Reviewed and stable  Last Vitals:  Vitals:   10/28/16 1045 10/28/16 1229  BP: (!) 158/58 (!) 167/70  Pulse: 70   Resp: 16 18  Temp: (!) 35.8 C     Last Pain:  Vitals:   10/28/16 1045  TempSrc: Tympanic         Complications: No apparent anesthesia complications

## 2016-10-28 NOTE — Anesthesia Preprocedure Evaluation (Signed)
Anesthesia Evaluation  Patient identified by MRN, date of birth, ID band Patient awake    Reviewed: Allergy & Precautions, NPO status , Patient's Chart, lab work & pertinent test results, reviewed documented beta blocker date and time   Airway Mallampati: II       Dental   Pulmonary pneumonia, resolved,    Pulmonary exam normal        Cardiovascular hypertension, Pt. on medications and Pt. on home beta blockers Normal cardiovascular exam     Neuro/Psych Seizures -, Well Controlled,  negative psych ROS   GI/Hepatic   Endo/Other    Renal/GU      Musculoskeletal   Abdominal Normal abdominal exam  (+)   Peds negative pediatric ROS (+)  Hematology   Anesthesia Other Findings Past Medical History: No date: Dizziness No date: Hypertension No date: Pneumonia No date: Seizures ([place])  Reproductive/Obstetrics                             Anesthesia Physical Anesthesia Plan  ASA: III  Anesthesia Plan: MAC   Post-op Pain Management:    Induction: Intravenous  Airway Management Planned: Nasal Cannula  Additional Equipment:   Intra-op Plan:   Post-operative Plan:   Informed Consent: I have reviewed the patients History and Physical, chart, labs and discussed the procedure including the risks, benefits and alternatives for the proposed anesthesia with the patient or authorized representative who has indicated his/her understanding and acceptance.   Dental advisory given  Plan Discussed with: CRNA and Surgeon  Anesthesia Plan Comments:         Anesthesia Quick Evaluation

## 2016-10-28 NOTE — Anesthesia Postprocedure Evaluation (Signed)
Anesthesia Post Note  Patient: [name]  Procedure(s) Performed: Procedure(s) (LRB): CATARACT EXTRACTION PHACO AND INTRAOCULAR LENS PLACEMENT (IOC) (Right)  Patient location during evaluation: PACU Anesthesia Type: MAC Pain management: pain level controlled Vital Signs Assessment: post-procedure vital signs reviewed and stable Respiratory status: spontaneous breathing Cardiovascular status: blood pressure returned to baseline Anesthetic complications: no     Last Vitals:  Vitals:   10/28/16 1229 10/28/16 1231  BP: (!) 167/70 (!) 167/70  Pulse:  63  Resp: 18 16  Temp:  36.1 C    Last Pain:  Vitals:   10/28/16 1231  TempSrc: Temporal                 Adele Milson

## 2016-10-28 NOTE — Discharge Instructions (Signed)
Eye Surgery Discharge Instructions  Expect mild scratchy sensation or mild soreness. DO NOT RUB YOUR EYE!  The day of surgery:  Minimal physical activity, but bed rest is not required  No reading, computer work, or close hand work  No bending, lifting, or straining.  May watch TV  For 24 hours:  No driving, legal decisions, or alcoholic beverages  Safety precautions  Eat anything you prefer: It is better to start with liquids, then soup then solid foods.  _____ Eye patch should be worn until postoperative exam tomorrow.  ____ Solar shield eyeglasses should be worn for comfort in the sunlight/patch while sleeping  Resume all regular medications including aspirin or Coumadin if these were discontinued prior to surgery. You may shower, bathe, shave, or wash your hair. Tylenol may be taken for mild discomfort.  Call your doctor if you experience significant pain, nausea, or vomiting, fever > 101 or other signs of infection. [phone] or [phone] Specific instructions:  Follow-up Information    Eulogio Bear, MD Follow up on 10/29/2016.   Specialty:  Ophthalmology Why:  9:45 Contact information: [address] [place] [address] [phone]

## 2016-10-28 NOTE — H&P (Signed)
The History and Physical notes are on paper, have been signed, and are to be scanned.   I have examined the patient and there are no changes to the H&P.   Benay Pillow 10/28/2016 11:19 AM

## 2016-10-28 NOTE — Op Note (Signed)
OPERATIVE NOTE  Lisa Eaton [id number] 10/28/2016   PREOPERATIVE DIAGNOSIS:  Nuclear sclerotic cataract right eye.  H25.11   POSTOPERATIVE DIAGNOSIS:    Nuclear sclerotic cataract right eye.     PROCEDURE:  Phacoemusification with posterior chamber intraocular lens placement of the right eye   LENS:   Implant Name Type Inv. Item Serial No. Manufacturer Lot No. LRB No. Used  LENS IOL DIOP 16.5 - [id number] Intraocular Lens LENS IOL DIOP 16.5 [id number]   Right 1       PCB00 +16.5   ULTRASOUND TIME: 0 minutes 34 seconds.  CDE 3.13   SURGEON:  Benay Pillow, MD, MPH  ANESTHESIOLOGIST: Anesthesiologist: Alvin Critchley, MD CRNA: Aline Brochure, CRNA; Jonna Clark, CRNA   ANESTHESIA:  Topical with tetracaine drops augmented with 1% preservative-free intracameral lidocaine.  ESTIMATED BLOOD LOSS: less than 1 mL.   COMPLICATIONS:  None.   DESCRIPTION OF PROCEDURE:  The patient was identified in the holding room and transported to the operating room and placed in the supine position under the operating microscope.  The right eye was identified as the operative eye and it was prepped and draped in the usual sterile ophthalmic fashion.   A 1.0 millimeter clear-corneal paracentesis was made at the 10:30 position. 0.5 ml of preservative-free 1% lidocaine with epinephrine was injected into the anterior chamber.  The anterior chamber was filled with Healon 5 viscoelastic.  A 2.4 millimeter keratome was used to make a near-clear corneal incision at the 8:00 position.  A curvilinear capsulorrhexis was made with a cystotome and capsulorrhexis forceps.  Balanced salt solution was used to hydrodissect and hydrodelineate the nucleus.   Phacoemulsification was then used in stop and chop fashion to remove the lens nucleus and epinucleus.  The remaining cortex was then removed using the irrigation and aspiration handpiece. Healon was then placed into the capsular bag to distend it for lens  placement.  A lens was then injected into the capsular bag.  The remaining viscoelastic was aspirated.   Wounds were hydrated with balanced salt solution.  The anterior chamber was inflated to a physiologic pressure with balanced salt solution.   Intracameral vigamox 0.1 mL undiluted was injected into the eye and a drop placed onto the ocular surface.  No wound leaks were noted.  The patient was taken to the recovery room in stable condition without complications of anesthesia or surgery  Benay Pillow 10/28/2016, 12:26 PM

## 2016-10-28 NOTE — Anesthesia Post-op Follow-up Note (Cosign Needed)
Anesthesia QCDR form completed.        

## 2016-11-16 ENCOUNTER — Encounter: Payer: Self-pay | Admitting: *Deleted

## 2016-11-25 ENCOUNTER — Encounter
Admission: RE | Disposition: A | Discharge status: Home / Self Care | Payer: Self-pay | Source: Ambulatory Visit | Attending: Ophthalmology

## 2016-11-25 ENCOUNTER — Ambulatory Visit: Payer: MEDICARE | Admitting: Anesthesiology

## 2016-11-25 ENCOUNTER — Encounter: Payer: Self-pay | Admitting: Anesthesiology

## 2016-11-25 ENCOUNTER — Ambulatory Visit
Admission: RE | Admit: 2016-11-25 | Discharge: 2016-11-25 | Disposition: A | Discharge status: Home / Self Care | Payer: MEDICARE | Source: Ambulatory Visit | Attending: Ophthalmology | Admitting: Ophthalmology

## 2016-11-25 DIAGNOSIS — H2512 Age-related nuclear cataract, left eye: Secondary | ICD-10-CM | POA: Insufficient documentation

## 2016-11-25 DIAGNOSIS — R569 Unspecified convulsions: Secondary | ICD-10-CM | POA: Insufficient documentation

## 2016-11-25 DIAGNOSIS — Z85828 Personal history of other malignant neoplasm of skin: Secondary | ICD-10-CM | POA: Diagnosis not present

## 2016-11-25 DIAGNOSIS — G8389 Other specified paralytic syndromes: Secondary | ICD-10-CM | POA: Diagnosis not present

## 2016-11-25 DIAGNOSIS — I10 Essential (primary) hypertension: Secondary | ICD-10-CM | POA: Diagnosis not present

## 2016-11-25 DIAGNOSIS — Z880 Allergy status to penicillin: Secondary | ICD-10-CM | POA: Diagnosis not present

## 2016-11-25 DIAGNOSIS — M810 Age-related osteoporosis without current pathological fracture: Secondary | ICD-10-CM | POA: Insufficient documentation

## 2016-11-25 HISTORY — DX: Unspecified abnormalities of gait and mobility: R26.9

## 2016-11-25 HISTORY — DX: Unspecified hearing loss, unspecified ear: H91.90

## 2016-11-25 HISTORY — PX: CATARACT EXTRACTION W/PHACO: SHX586

## 2016-11-25 HISTORY — DX: Malignant (primary) neoplasm, unspecified: C80.1

## 2016-11-25 HISTORY — DX: Anemia, unspecified: D64.9

## 2016-11-25 SURGERY — PHACOEMULSIFICATION, CATARACT, WITH IOL INSERTION
Anesthesia: Monitor Anesthesia Care | Site: Eye | Laterality: Left | Wound class: Clean

## 2016-11-25 MED ORDER — SODIUM CHLORIDE 0.9 % IV SOLN
INTRAVENOUS | Status: DC
  Administered 2016-11-25 (×2): via INTRAVENOUS

## 2016-11-25 MED ORDER — MOXIFLOXACIN HCL 0.5 % OP SOLN
OPHTHALMIC | Status: DC | PRN
Start: 2016-11-25 — End: 2016-11-25
  Administered 2016-11-25: 0.2 mL via OPHTHALMIC

## 2016-11-25 MED ORDER — BSS IO SOLN
INTRAOCULAR | Status: DC | PRN
  Administered 2016-11-25: 1 via INTRAOCULAR

## 2016-11-25 MED ORDER — TETRACAINE HCL 0.5 % OP SOLN
OPHTHALMIC | Status: DC | PRN
  Administered 2016-11-25: 2 [drp] via OPHTHALMIC

## 2016-11-25 MED ORDER — LIDOCAINE HCL (PF) 4 % IJ SOLN
INTRAMUSCULAR | Status: DC | PRN
  Administered 2016-11-25: 4 mL via OPHTHALMIC

## 2016-11-25 MED ORDER — MOXIFLOXACIN HCL 0.5 % OP SOLN
1.0000 [drp] | OPHTHALMIC | Status: DC | PRN
Start: 2016-11-25 — End: 2016-11-25
  Filled 2016-11-25: qty 3

## 2016-11-25 MED ORDER — SODIUM HYALURONATE 23 MG/ML IO SOLN
INTRAOCULAR | Status: DC | PRN
Start: 2016-11-25 — End: 2016-11-25
  Administered 2016-11-25: 0.6 mL via INTRAOCULAR

## 2016-11-25 MED ORDER — ARMC OPHTHALMIC DILATING DROPS
1.0000 "application " | OPHTHALMIC | Status: AC
  Administered 2016-11-25 (×3): 1 via OPHTHALMIC
  Filled 2016-11-25: qty 0.4

## 2016-11-25 MED ORDER — SODIUM HYALURONATE 10 MG/ML IO SOLN
INTRAOCULAR | Status: DC | PRN
  Administered 2016-11-25: 0.55 mL via INTRAOCULAR

## 2016-11-25 MED ORDER — PHENYLEPHRINE HCL 10 % OP SOLN: 1.0000 [drp] | OPHTHALMIC | Status: DC | PRN

## 2016-11-25 MED ORDER — POVIDONE-IODINE 5 % OP SOLN
OPHTHALMIC | Status: DC | PRN
  Administered 2016-11-25: 1 via OPHTHALMIC

## 2016-11-25 SURGICAL SUPPLY — 15 items
DISSECTOR HYDRO NUCLEUS 50X22 (MISCELLANEOUS) ×2 IMPLANT
GLOVE BIO SURGEON STRL SZ8 (GLOVE) ×2 IMPLANT
GLOVE BIOGEL M 6.5 STRL (GLOVE) ×2 IMPLANT
GLOVE SURG LX 7.5 STRW (GLOVE) ×1
GLOVE SURG LX STRL 7.5 STRW (GLOVE) ×1 IMPLANT
GOWN STRL REUS W/ TWL LRG LVL3 (GOWN DISPOSABLE) ×2 IMPLANT
GOWN STRL REUS W/TWL LRG LVL3 (GOWN DISPOSABLE) ×2
LENS IOL TECNIS ITEC 16.5 (Intraocular Lens) ×2 IMPLANT
PACK CATARACT (MISCELLANEOUS) ×2 IMPLANT
PACK CATARACT KING (MISCELLANEOUS) ×2 IMPLANT
PACK EYE AFTER SURG (MISCELLANEOUS) ×2 IMPLANT
SOL BSS BAG (MISCELLANEOUS) ×2
SOLUTION BSS BAG (MISCELLANEOUS) ×1 IMPLANT
WATER STERILE IRR 250ML POUR (IV SOLUTION) ×2 IMPLANT
WIPE NON LINTING 3.25X3.25 (MISCELLANEOUS) ×2 IMPLANT

## 2016-11-25 NOTE — Anesthesia Post-op Follow-up Note (Cosign Needed)
Anesthesia QCDR form completed.        

## 2016-11-25 NOTE — H&P (Signed)
The History and Physical notes are on paper, have been signed, and are to be scanned.   I have examined the patient and there are no changes to the H&P.   Benay Pillow 11/25/2016 9:22 AM

## 2016-11-25 NOTE — Anesthesia Preprocedure Evaluation (Signed)
Anesthesia Evaluation  Patient identified by MRN, date of birth, ID band Patient awake    Reviewed: Allergy & Precautions, NPO status , Patient's Chart, lab work & pertinent test results, reviewed documented beta blocker date and time   Airway Mallampati: II  TM Distance: <3 FB     Dental   Pulmonary pneumonia, resolved,    Pulmonary exam normal        Cardiovascular hypertension, Pt. on medications and Pt. on home beta blockers Normal cardiovascular exam     Neuro/Psych Seizures -, Well Controlled,  Partial R sided paralysis negative psych ROS   GI/Hepatic negative GI ROS, Neg liver ROS,   Endo/Other  negative endocrine ROS  Renal/GU negative Renal ROS     Musculoskeletal   Abdominal Normal abdominal exam  (+)   Peds negative pediatric ROS (+)  Hematology  (+) anemia ,   Anesthesia Other Findings Past Medical History: No date: Dizziness No date: Hypertension No date: Pneumonia No date: Seizures ([facility])  Reproductive/Obstetrics                             Anesthesia Physical  Anesthesia Plan  ASA: III  Anesthesia Plan: MAC   Post-op Pain Management:    Induction: Intravenous  PONV Risk Score and Plan:   Airway Management Planned: Nasal Cannula  Additional Equipment:   Intra-op Plan:   Post-operative Plan:   Informed Consent: I have reviewed the patients History and Physical, chart, labs and discussed the procedure including the risks, benefits and alternatives for the proposed anesthesia with the patient or authorized representative who has indicated his/her understanding and acceptance.   Dental advisory given  Plan Discussed with: CRNA and Surgeon  Anesthesia Plan Comments:         Anesthesia Quick Evaluation

## 2016-11-25 NOTE — Discharge Instructions (Addendum)
Eye Surgery Discharge Instructions  Expect mild scratchy sensation or mild soreness. DO NOT RUB YOUR EYE!  The day of surgery:  Minimal physical activity, but bed rest is not required  No reading, computer work, or close hand work  No bending, lifting, or straining.  May watch TV  For 24 hours:  No driving, legal decisions, or alcoholic beverages  Safety precautions  Eat anything you prefer: It is better to start with liquids, then soup then solid foods.  _____ Eye patch should be worn until postoperative exam tomorrow.  ____ Solar shield eyeglasses should be worn for comfort in the sunlight/patch while sleeping  Resume all regular medications including aspirin or Coumadin if these were discontinued prior to surgery. You may shower, bathe, shave, or wash your hair. Tylenol may be taken for mild discomfort.  Call your doctor if you experience significant pain, nausea, or vomiting, fever > 101 or other signs of infection. [phone] or [phone] Specific instructions:  Follow-up Information    Eulogio Bear, MD Follow up.   Specialty:  Ophthalmology Why:  11/26/16 at 10:35 Contact information: [address] [place]  [address] [phone]          Eye Surgery Discharge Instructions  Expect mild scratchy sensation or mild soreness. DO NOT RUB YOUR EYE!  The day of surgery:  Minimal physical activity, but bed rest is not required  No reading, computer work, or close hand work  No bending, lifting, or straining.  May watch TV  For 24 hours:  No driving, legal decisions, or alcoholic beverages  Safety precautions  Eat anything you prefer: It is better to start with liquids, then soup then solid foods.  _____ Eye patch should be worn until postoperative exam tomorrow.  ____ Solar shield eyeglasses should be worn for comfort in the sunlight/patch while sleeping  Resume all regular medications including aspirin or Coumadin if these were  discontinued prior to surgery. You may shower, bathe, shave, or wash your hair. Tylenol may be taken for mild discomfort.  Call your doctor if you experience significant pain, nausea, or vomiting, fever > 101 or other signs of infection. [phone] or [phone] Specific instructions:  Follow-up Information    Eulogio Bear, MD Follow up.   Specialty:  Ophthalmology Why:  11/26/16 at 10:35 Contact information: [address] [place] [address] [phone]

## 2016-11-25 NOTE — Transfer of Care (Signed)
Immediate Anesthesia Transfer of Care Note  Patient: Lisa Eaton  Procedure(s) Performed: Procedure(s) with comments: CATARACT EXTRACTION PHACO AND INTRAOCULAR LENS PLACEMENT ([place]) (Left) - Lot # [id number] H [place]: 00:31.8 AP%: 8.8 CDE: 2.80  Patient Location: PACU  Anesthesia Type:MAC  Level of Consciousness: awake, alert , oriented and patient cooperative  Airway & Oxygen Therapy: Patient Spontanous Breathing  Post-op Assessment: Report given to RN, Post -op Vital signs reviewed and stable and Patient moving all extremities X 4  Post vital signs: Reviewed and stable  Last Vitals:  Vitals:   11/25/16 0901  BP: 133/60  Pulse: 63  Resp: 18  Temp: 36.4 C    Last Pain:  Vitals:   11/25/16 0901  TempSrc: Oral  PainSc: 0-No pain         Complications: No apparent anesthesia complications

## 2016-11-25 NOTE — Anesthesia Postprocedure Evaluation (Signed)
Anesthesia Post Note  Patient: Lisa Eaton  Procedure(s) Performed: Procedure(s) (LRB): CATARACT EXTRACTION PHACO AND INTRAOCULAR LENS PLACEMENT ([place]) (Left)  Patient location during evaluation: PACU Anesthesia Type: MAC Level of consciousness: awake and alert Pain management: pain level controlled Vital Signs Assessment: post-procedure vital signs reviewed and stable Respiratory status: spontaneous breathing, nonlabored ventilation and respiratory function stable Cardiovascular status: stable and blood pressure returned to baseline Anesthetic complications: no     Last Vitals:  Vitals:   11/25/16 0901  BP: 133/60  Pulse: 63  Resp: 18  Temp: 36.4 C    Last Pain:  Vitals:   11/25/16 0901  TempSrc: Oral  PainSc: 0-No pain                 Silvana Newness A

## 2016-11-25 NOTE — Op Note (Signed)
OPERATIVE NOTE  Lisa Eaton [id number] 11/25/2016   PREOPERATIVE DIAGNOSIS:  Nuclear sclerotic cataract left eye.  H25.12   POSTOPERATIVE DIAGNOSIS:    Nuclear sclerotic cataract left eye.     PROCEDURE:  Phacoemusification with posterior chamber intraocular lens placement of the left eye   LENS:   Implant Name Type Inv. Item Serial No. Manufacturer Lot No. LRB No. Used  LENS IOL DIOP 16.5 - [id number] Intraocular Lens LENS IOL DIOP 16.5 [id number]   Left 1       PCB00 +16.5   ULTRASOUND TIME: 0 minutes 31.8 seconds.  CDE 2.80   SURGEON:  Benay Pillow, MD, MPH   ANESTHESIA:  Topical with tetracaine drops augmented with 1% preservative-free intracameral lidocaine.  ESTIMATED BLOOD LOSS: <1 mL   COMPLICATIONS:  None.   DESCRIPTION OF PROCEDURE:  The patient was identified in the holding room and transported to the operating room and placed in the supine position under the operating microscope.  The left eye was identified as the operative eye and it was prepped and draped in the usual sterile ophthalmic fashion.   A 1.0 millimeter clear-corneal paracentesis was made at the 5:00 position. 0.5 ml of preservative-free 1% lidocaine with epinephrine was injected into the anterior chamber.  The anterior chamber was filled with Healon 5 viscoelastic.  A 2.4 millimeter keratome was used to make a near-clear corneal incision at the 2:00 position.  A curvilinear capsulorrhexis was made with a cystotome and capsulorrhexis forceps.  Balanced salt solution was used to hydrodissect and hydrodelineate the nucleus.   Phacoemulsification was then used in stop and chop fashion to remove the lens nucleus and epinucleus.  The remaining cortex was then removed using the irrigation and aspiration handpiece. Healon was then placed into the capsular bag to distend it for lens placement.  A lens was then injected into the capsular bag.  The remaining viscoelastic was aspirated.   Wounds were hydrated  with balanced salt solution.  The anterior chamber was inflated to a physiologic pressure with balanced salt solution.  Intracameral vigamox 0.1 mL undiltued was injected into the eye and a drop placed onto the ocular surface.  No wound leaks were noted.  The patient was taken to the recovery room in stable condition without complications of anesthesia or surgery  Benay Pillow 11/25/2016, 9:57 AM

## 2017-10-07 ENCOUNTER — Encounter: Payer: Self-pay | Admitting: Emergency Medicine

## 2017-10-07 ENCOUNTER — Emergency Department: Payer: Medicare Other

## 2017-10-07 ENCOUNTER — Other Ambulatory Visit: Payer: Self-pay

## 2017-10-07 ENCOUNTER — Emergency Department
Admission: EM | Admit: 2017-10-07 | Discharge: 2017-10-07 | Disposition: A | Discharge status: Home / Self Care | Payer: Medicare Other | Attending: Emergency Medicine | Admitting: Emergency Medicine

## 2017-10-07 DIAGNOSIS — S060X0A Concussion without loss of consciousness, initial encounter: Secondary | ICD-10-CM | POA: Insufficient documentation

## 2017-10-07 DIAGNOSIS — Y999 Unspecified external cause status: Secondary | ICD-10-CM | POA: Diagnosis not present

## 2017-10-07 DIAGNOSIS — I10 Essential (primary) hypertension: Secondary | ICD-10-CM | POA: Diagnosis not present

## 2017-10-07 DIAGNOSIS — Z85828 Personal history of other malignant neoplasm of skin: Secondary | ICD-10-CM | POA: Diagnosis not present

## 2017-10-07 DIAGNOSIS — Y92002 Bathroom of unspecified non-institutional (private) residence single-family (private) house as the place of occurrence of the external cause: Secondary | ICD-10-CM | POA: Diagnosis not present

## 2017-10-07 DIAGNOSIS — W0110XA Fall on same level from slipping, tripping and stumbling with subsequent striking against unspecified object, initial encounter: Secondary | ICD-10-CM | POA: Insufficient documentation

## 2017-10-07 DIAGNOSIS — Z79899 Other long term (current) drug therapy: Secondary | ICD-10-CM | POA: Diagnosis not present

## 2017-10-07 DIAGNOSIS — W19XXXA Unspecified fall, initial encounter: Secondary | ICD-10-CM

## 2017-10-07 DIAGNOSIS — F0390 Unspecified dementia without behavioral disturbance: Secondary | ICD-10-CM | POA: Insufficient documentation

## 2017-10-07 DIAGNOSIS — S0990XA Unspecified injury of head, initial encounter: Secondary | ICD-10-CM | POA: Diagnosis present

## 2017-10-07 DIAGNOSIS — Y939 Activity, unspecified: Secondary | ICD-10-CM | POA: Insufficient documentation

## 2017-10-07 LAB — URINALYSIS, COMPLETE (UACMP) WITH MICROSCOPIC
BACTERIA UA: NONE SEEN
Bilirubin Urine: NEGATIVE
Glucose, UA: NEGATIVE mg/dL
Hgb urine dipstick: NEGATIVE
Ketones, ur: 20 mg/dL — AB
Leukocytes, UA: NEGATIVE
Nitrite: NEGATIVE
PROTEIN: NEGATIVE mg/dL
SPECIFIC GRAVITY, URINE: 1.006 (ref 1.005–1.030)
pH: 7 (ref 5.0–8.0)

## 2017-10-07 NOTE — ED Provider Notes (Signed)
[facility] Emergency Department Provider Note  ____________________________________________  Time seen: Approximately 10:27 AM  I have reviewed the triage vital signs and the nursing notes.   HISTORY  Chief Complaint Fall   HPI Lisa Eaton is a 85 y.o. 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  According to patient and her husband, patient had a fall 5 days ago in the bathroom and hit the left side of her head which is where her craniotomy site is. NO obvious external trauma, no LOC, not on blood thinners. Since then husband reports the patient has been complaining daily of a headache.  Headache is located on the left side and usually worse when she is laying down and in the morning and gets better as she gets up and moves around. This morning she was crying in bed due to severe HA however that seems to resolve after he got her up from the bed.  Patient denies any headache at this time. She has not taken any medications for the pain at home.  Patient does have a history of dementia but according to the husband she has had no acute changes in her mental status.  No nausea or vomiting.  No changes in vision.  Patient is currently being treated for a UTI.  She has no flank pain, no abdominal pain, no fever chills, no dysuria or hematuria.   Past Medical History:  Diagnosis Date  . Abnormal gait    DUE TO RIGHT SIDE PARALYSIS  . Anemia   . Cancer ([place])    SKIN  . Dizziness   . HOH (hard of hearing)   . Hypertension   . Partial paralysis of right hand (HCC)    partial paralysis right arm and right leg  . Pneumonia   . Seizures [facility])     Patient Active Problem List   Diagnosis Date Noted  . CAP (community acquired pneumonia) 05/26/2013  . Convulsions/seizures ([place]) 05/26/2013  . Hypokalemia 05/26/2013  . Altered mental status  05/25/2013  . Encephalopathy 05/25/2013    Past Surgical History:  Procedure Laterality Date  . BRAIN SURGERY     CRANIOTOMY/ PART OF FRONTAL LOBE REMOVED  . CATARACT EXTRACTION W/PHACO Right 10/28/2016   Procedure: CATARACT EXTRACTION PHACO AND INTRAOCULAR LENS PLACEMENT (IOC);  Surgeon: Eulogio Bear, MD;  Location: [facility] ORS;  Service: Ophthalmology;  Laterality: Right;  [place] 00:34.1 AP% 9.1 CDE 3.13 FLUID LOT # [id number]  . CATARACT EXTRACTION W/PHACO Left 11/25/2016   Procedure: CATARACT EXTRACTION PHACO AND INTRAOCULAR LENS PLACEMENT ([place]);  Surgeon: Eulogio Bear, MD;  Location: [facility] ORS;  Service: Ophthalmology;  Laterality: Left;  Lot # [id number] H [place]: 00:31.8 AP%: 8.8 CDE: 2.80    Prior to Admission medications   Medication Sig Start Date End Date Taking? Authorizing Provider  amLODipine (NORVASC) 5 MG tablet Take 5 mg by mouth daily.    [provider]  cholecalciferol (VITAMIN D) 1000 UNITS tablet Take 1,000 Units by mouth daily.    [provider]  donepezil (ARICEPT) 10 MG tablet Take 10 mg by mouth at bedtime.    [provider]  doxycycline (VIBRAMYCIN) 100 MG capsule Take 1 capsule (100 mg total) by mouth 2 (two) times daily. Patient not taking: Reported on 11/16/2016 05/29/13   Geradine Girt, DO  gabapentin (NEURONTIN) 600 MG tablet Take 600 mg by  mouth 3 (three) times daily.     [provider]  labetalol (NORMODYNE) 200 MG tablet Take 200 mg by mouth 2 (two) times daily.    [provider]  losartan (COZAAR) 100 MG tablet Take 100 mg by mouth daily.    [provider]  phenytoin (DILANTIN) 100 MG ER capsule Take 1 capsule (100 mg total) by mouth 3 (three) times daily. Patient taking differently: Take 100 mg by mouth 2 (two) times daily. 1 PILL AM 2PILLS PM 05/29/13   Eulogio Bear U, DO  primidone (MYSOLINE) 250 MG tablet Take 250 mg by mouth 2 (two) times daily.    [provider]  raloxifene  (EVISTA) 60 MG tablet Take 60 mg by mouth daily.    [provider]  vitamin B-12 (CYANOCOBALAMIN) 1000 MCG tablet Take 1,000 mcg by mouth daily.    [provider]    Allergies Penicillins  No family history on file.  Social History Social History   Tobacco Use  . Smoking status: Never Smoker  . Smokeless tobacco: Never Used  Substance Use Topics  . Alcohol use: Yes    Alcohol/week: 0.6 oz    Types: 1 Glasses of wine per week  . Drug use: No    Review of Systems Constitutional: Negative for fever. Eyes: Negative for visual changes. ENT: Negative for facial injury or neck injury Cardiovascular: Negative for chest injury. Respiratory: Negative for shortness of breath. Negative for chest wall injury. Gastrointestinal: Negative for abdominal pain or injury. Genitourinary: Negative for dysuria. Musculoskeletal: Negative for back injury, negative for arm or leg pain. Skin: Negative for laceration/abrasions. Neurological: + head injury.   ____________________________________________   PHYSICAL EXAM:  VITAL SIGNS: ED Triage Vitals  Enc Vitals Group     BP 10/07/17 0854 (!) 155/62     Pulse Rate 10/07/17 0854 78     Resp --      Temp 10/07/17 0854 98.2 F (36.8 C)     Temp Source 10/07/17 0854 Oral     SpO2 10/07/17 0854 94 %     Weight 10/07/17 0900 110 lb (49.9 kg)     Height 10/07/17 0900 5\' 2"  (1.575 m)     Head Circumference --      Peak Flow --      Pain Score 10/07/17 0900 0     Pain Loc --      Pain Edu? --      Excl. in [place]? --     Constitutional: Alert and oriented. No acute distress. Does not appear intoxicated. HEENT Head: Normocephalic and atraumatic. L craniotomy site intact with no obvious trauma Face: No facial bony tenderness. Stable midface Ears: No hemotympanum bilaterally. No Battle sign Eyes: No eye injury. PERRL. No raccoon eyes Nose: Nontender. No epistaxis. No rhinorrhea Mouth/Throat: Mucous membranes are moist. No  oropharyngeal blood. No dental injury. Airway patent without stridor. Normal voice. Neck: no C-collar in place. No midline c-spine tenderness.  Cardiovascular: Normal rate, regular rhythm. Normal and symmetric distal pulses are present in all extremities. Pulmonary/Chest: Chest wall is stable and nontender to palpation/compression. Normal respiratory effort. Breath sounds are normal. No crepitus.  Abdominal: Soft, nontender, non distended. Musculoskeletal: Nontender with normal full range of motion in all extremities. No deformities. No thoracic or lumbar midline spinal tenderness. Pelvis is stable. Skin: Skin is warm, dry and intact. No abrasions or contutions. Psychiatric: Speech and behavior are appropriate. Neurological: Normal speech and language. Moves all extremities to command. No  gross focal neurologic deficits are appreciated.  Glascow Coma Score: 4 - Opens eyes on own 6 - Follows simple motor commands 5 - Alert and oriented GCS: 15  ____________________________________________   LABS (all labs ordered are listed, but only abnormal results are displayed)  Labs Reviewed  URINALYSIS, COMPLETE (UACMP) WITH MICROSCOPIC - Abnormal; Notable for the following components:      Result Value   Color, Urine YELLOW (*)    APPearance HAZY (*)    Ketones, ur 20 (*)    All other components within normal limits   ____________________________________________  EKG  none  ____________________________________________  RADIOLOGY  I have personally reviewed the images performed during this visit and I agree with the Radiologist's read.   Interpretation by Radiologist:  Ct Head Wo Contrast  Result Date: 10/07/2017 CLINICAL DATA:  Minor head trauma, high clinical risk, initial exam. History of brain surgery as a child. EXAM: CT HEAD WITHOUT CONTRAST TECHNIQUE: Contiguous axial images were obtained from the base of the skull through the vertex without intravenous contrast. 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 There is a background of generalized atrophy with ventriculomegaly. Presumed chronic small vessel ischemia in the cerebral white matter. No evidence of acute infarct, hemorrhage, or mass. Vascular: Atherosclerotic calcification. Skull: Chronic smoothly contoured defect in the left frontal parietal calvarium. No acute or aggressive finding. Sinuses/Orbits: Mild mucosal thickening in the ethmoid sinuses. Negative orbits. IMPRESSION: 1. No evidence of acute injury. 2. 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 3. Atrophy and chronic small vessel ischemia. Electronically Signed   By: Monte Fantasia M.D.   On: 10/07/2017 10:04     ____________________________________________   PROCEDURES  Procedure(s) performed: None Procedures Critical Care performed:  None ____________________________________________   INITIAL IMPRESSION / ASSESSMENT AND PLAN / ED COURSE  84 y.o. 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  Patient has no symptoms at this time, she is alert and oriented x3, GCS of 15, well-appearing otherwise, no obvious trauma to the head and neck, no signs or symptoms of basilar skull fracture.  CT of the head was done which is negative for fracture or intracranial bleed.  At this time presentation is concerning for concussive headache.  I did repeat a urinalysis since patient is been treated for UTI on Macrobid and this seems to be clearing.  Recommended finishing that course.  Patient be discharged home on supportive care and follow-up with her neurologist.      As part of my medical decision making, I reviewed the following  data within the [place] notes reviewed and incorporated, Labs reviewed , Radiograph reviewed , Notes from prior ED visits and [place] Controlled Substance Database    Pertinent labs & imaging results that were available during my care of the patient were reviewed by me and considered in my medical decision making (see chart for details).    ____________________________________________   FINAL CLINICAL IMPRESSION(S) / ED DIAGNOSES  Final diagnoses:  Fall, initial encounter  Concussion without loss of consciousness, initial encounter      NEW MEDICATIONS STARTED DURING THIS VISIT:  ED Discharge Orders    None       Note:  This document was prepared using Dragon voice recognition software and may include unintentional dictation errors.    Rudene Re, MD 10/07/17 [phone]

## 2017-10-07 NOTE — ED Triage Notes (Signed)
Patient fell on Monday PM, here this AM with complaint of HA.  Husband states "she's had pain every morning but worse today".  Hx of brain surgery with missing part of her skull per husband from a fall as a child.  Dr. Brigitte Pulse is her neurologist.  Patient alert states she "feels good"  Denies HA at this time.  Right sided weakness from "head trauma" states husband.

## 2017-10-07 NOTE — ED Notes (Signed)
Patient is currently under tx for a UTI, patient states those sx have resolved.  Taking Nitrofurantoin Mono-MCR 100 mg twice a day for same.

## 2018-08-07 IMAGING — CT CT HEAD W/O CM
3 series · 16 of 47 positions shown, 19 images · non-contrast
Comparison: 11/25/2015

CLINICAL DATA: Minor head trauma, high clinical risk, initial exam.
History of brain surgery as a child.

EXAM:
CT HEAD WITHOUT CONTRAST
TECHNIQUE: Contiguous axial images were obtained from the base of the skull
through the vertex without intravenous contrast.

[Series 2: head wo · axial · 0.40mm/px · z∈[+530,+660]mm · 10 of 32 slices shown, 13 images]
[im 3/32  brain]
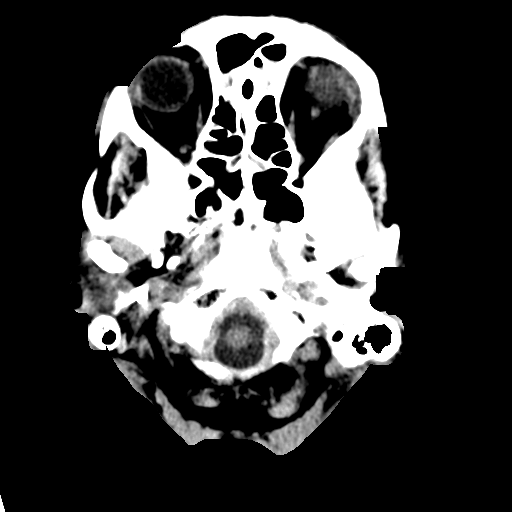
[im 3/32  bone]
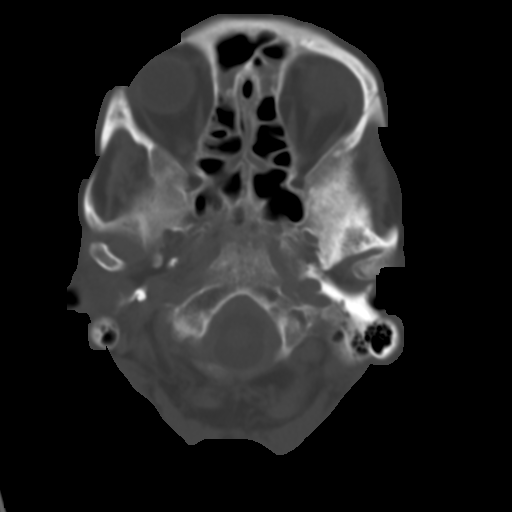
[im 6/32  brain]
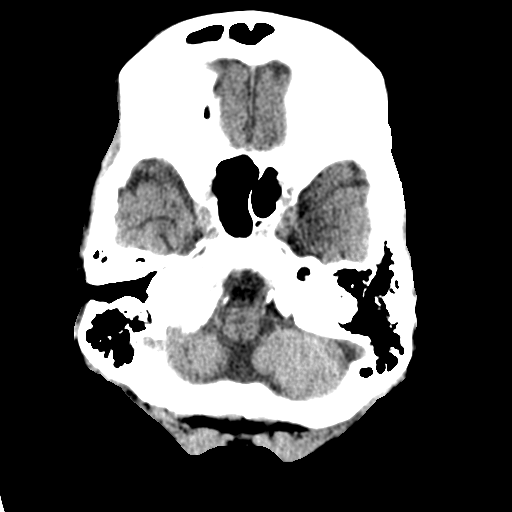
[im 9/32  brain]
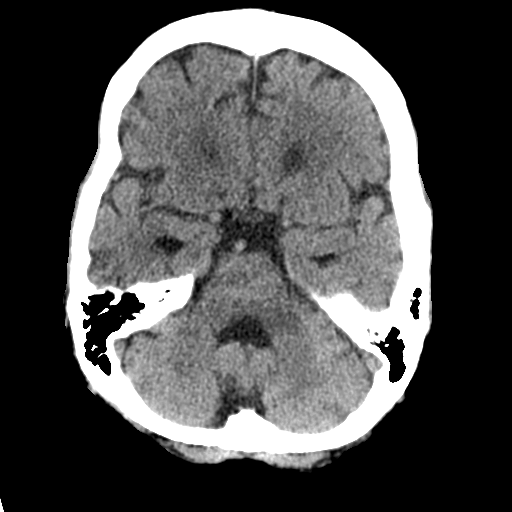
[im 11/32  brain]
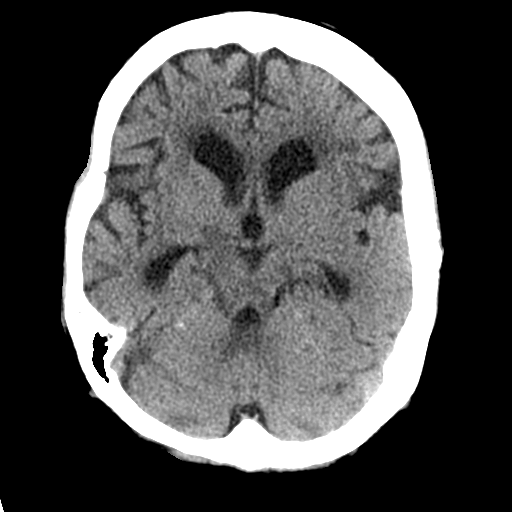
[im 14/32  brain]
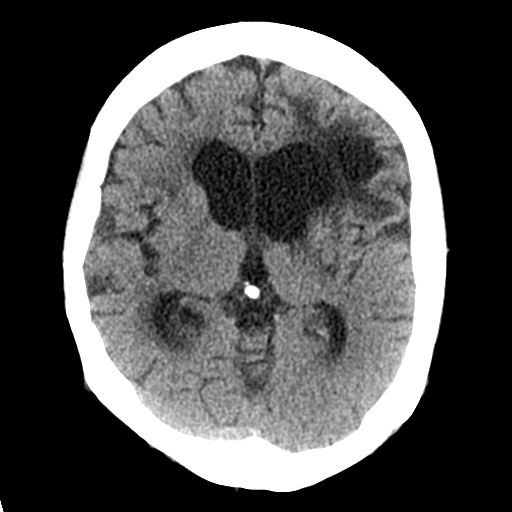
[im 14/32  bone]
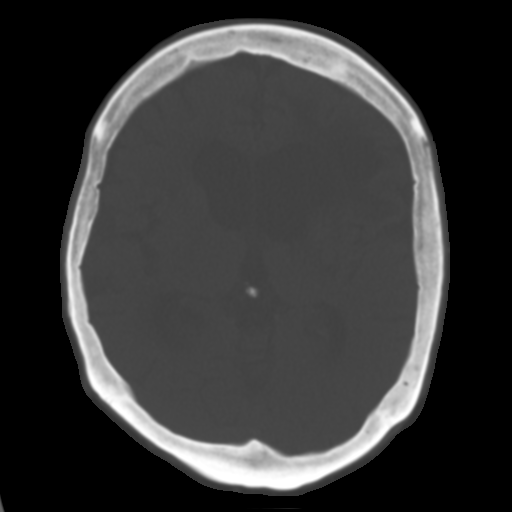
[im 18/32  brain]
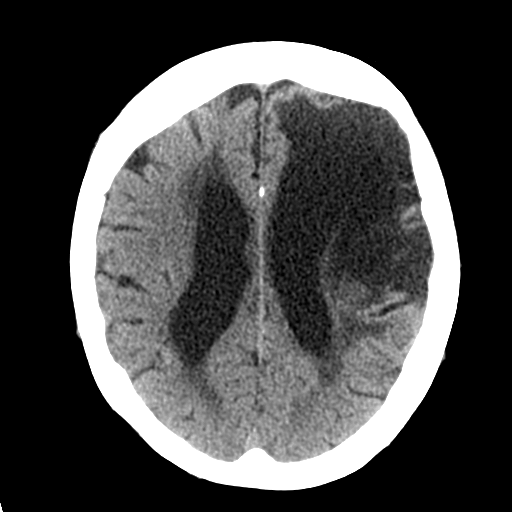
[im 21/32  brain]
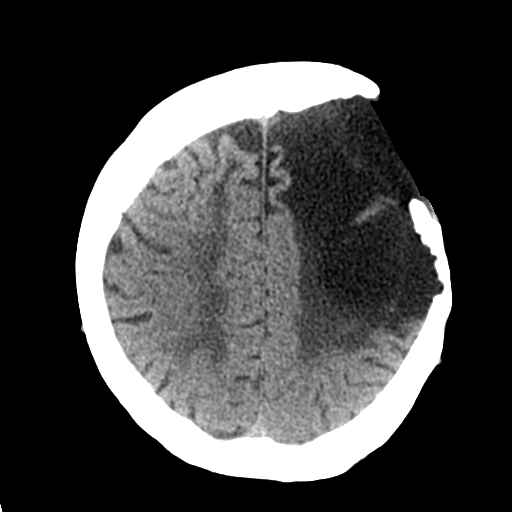
[im 24/32  brain]
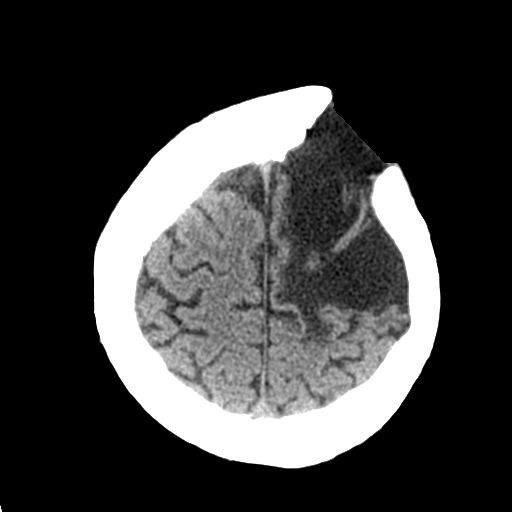
[im 26/32  brain]
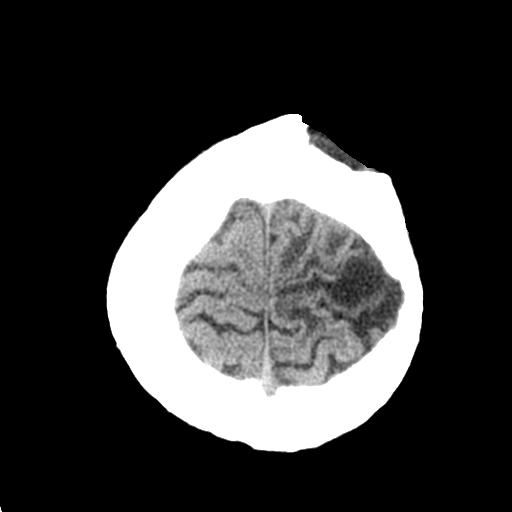
[im 26/32  bone]
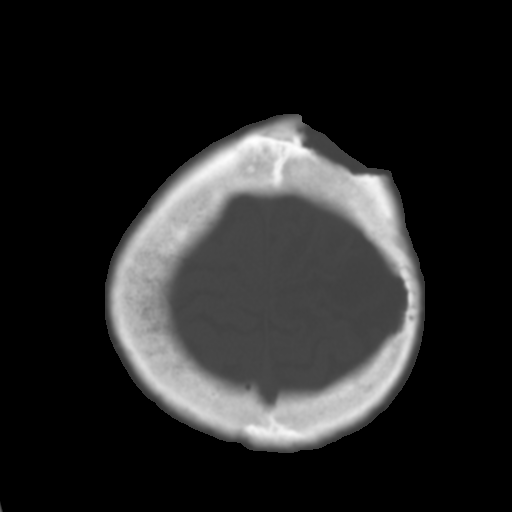
[im 29/32  brain]
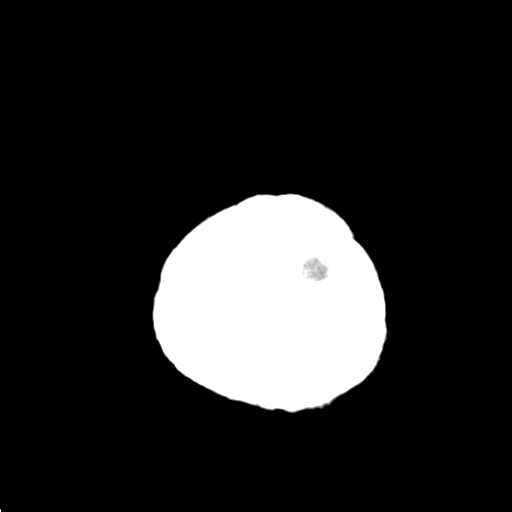

[Series 4: coronal soft tissue · coronal · 0.31mm/px · 3 of 66 slices shown]
[im 22/66  brain]
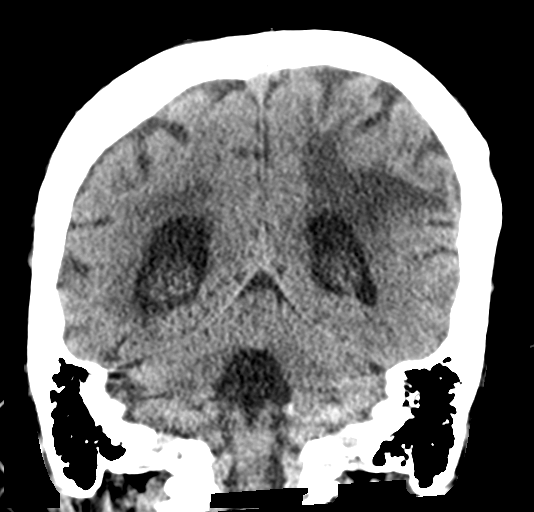
[im 29/66  brain]
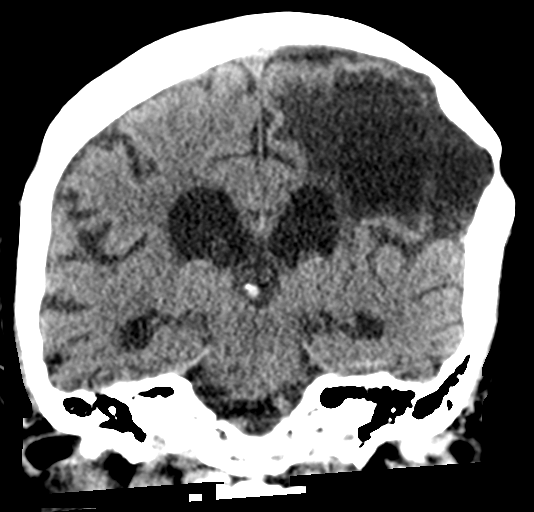
[im 37/66  brain]
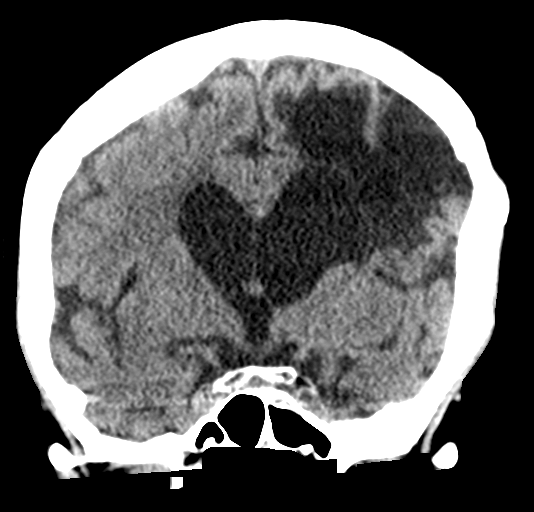

[Series 5: sagittal soft tissue · sagittal · 0.31mm/px · 3 of 56 slices shown]
[im 19/56  brain]
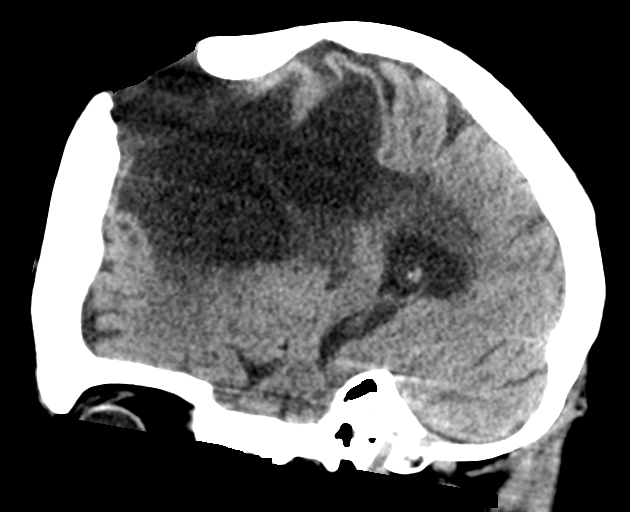
[im 28/56  brain]
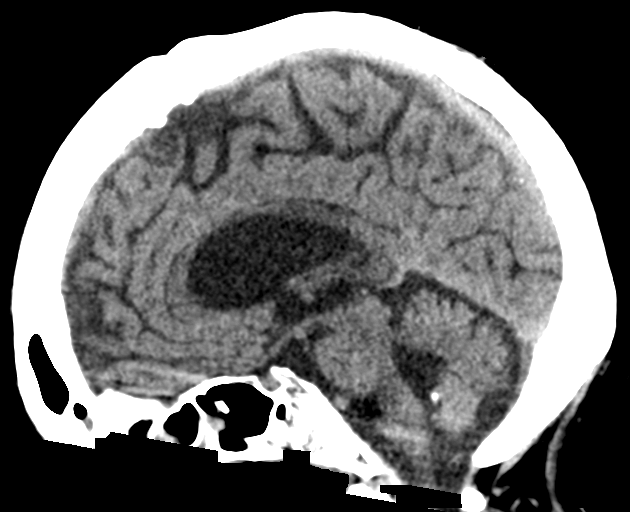
[im 37/56  brain]
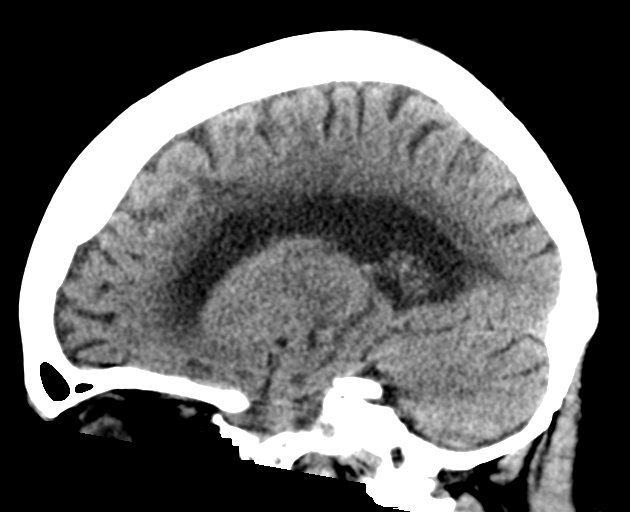

[16 of 47 positions shown; findings below may reference images not displayed]

FINDINGS: Brain: Extensive encephalomalacia and gliosis in the left frontal
lobe with leptomeningeal cyst bulging through the chronic calvarial
defect. There is a background of generalized atrophy with
ventriculomegaly. Presumed chronic small vessel ischemia in the
cerebral white matter. No evidence of acute infarct, hemorrhage, or
mass.

Vascular: Atherosclerotic calcification.

Skull: Chronic smoothly contoured defect in the left frontal
parietal calvarium. No acute or aggressive finding.

Sinuses/Orbits: Mild mucosal thickening in the ethmoid sinuses.
Negative orbits.
IMPRESSION: 1. No evidence of acute injury.
2. Extensive encephalomalacia in the left frontal lobe with
leptomeningeal cyst.
3. Atrophy and chronic small vessel ischemia.

## 2018-08-14 ENCOUNTER — Other Ambulatory Visit
Admission: RE | Admit: 2018-08-14 | Discharge: 2018-08-14 | Disposition: A | Discharge status: Home / Self Care | Payer: Medicare Other | Source: Ambulatory Visit | Attending: Neurology | Admitting: Neurology

## 2018-08-14 DIAGNOSIS — F0281 Dementia in other diseases classified elsewhere with behavioral disturbance: Secondary | ICD-10-CM | POA: Insufficient documentation

## 2018-08-14 DIAGNOSIS — R569 Unspecified convulsions: Secondary | ICD-10-CM | POA: Insufficient documentation

## 2018-08-14 DIAGNOSIS — F22 Delusional disorders: Secondary | ICD-10-CM | POA: Diagnosis present

## 2018-08-14 LAB — AMMONIA: AMMONIA: 23 umol/L (ref 9–35)

## 2019-03-07 ENCOUNTER — Other Ambulatory Visit
Admission: RE | Admit: 2019-03-07 | Discharge: 2019-03-07 | Disposition: A | Discharge status: Home / Self Care | Payer: Medicare Other | Source: Ambulatory Visit | Attending: Internal Medicine | Admitting: Internal Medicine

## 2019-03-07 DIAGNOSIS — G8191 Hemiplegia, unspecified affecting right dominant side: Secondary | ICD-10-CM | POA: Insufficient documentation

## 2019-03-07 DIAGNOSIS — G40909 Epilepsy, unspecified, not intractable, without status epilepticus: Secondary | ICD-10-CM | POA: Diagnosis present

## 2019-03-07 DIAGNOSIS — I10 Essential (primary) hypertension: Secondary | ICD-10-CM | POA: Diagnosis present

## 2019-03-07 LAB — PHENYTOIN LEVEL, TOTAL: Phenytoin Lvl: 21.7 ug/mL — ABNORMAL HIGH (ref 10.0–20.0)

## 2019-06-03 ENCOUNTER — Telehealth: Payer: Self-pay | Admitting: Unknown Physician Specialty

## 2019-06-03 ENCOUNTER — Other Ambulatory Visit: Payer: Self-pay | Admitting: Unknown Physician Specialty

## 2019-06-03 DIAGNOSIS — U071 COVID-19: Secondary | ICD-10-CM

## 2019-06-03 NOTE — Telephone Encounter (Signed)
  I connected by phone with Lisa Eaton daughter on 06/03/2019 at 10:05 AM to discuss the potential use of an new treatment for mild to moderate COVID-19 viral infection in non-hospitalized patients.  This patient is a 86 y.o. female that meets the FDA criteria for Emergency Use Authorization of bamlanivimab or casirivimab\imdevimab.  Has a (+) direct SARS-CoV-2 viral test result  Has mild or moderate COVID-19   Is ? 84 years of age and weighs ? 40 kg  Is NOT hospitalized due to COVID-19  Is NOT requiring oxygen therapy or requiring an increase in baseline oxygen flow rate due to COVID-19  Is within 10 days of symptom onset  Has at least one of the high risk factor(s) for progression to severe COVID-19 and/or hospitalization as defined in EUA.  Specific high risk criteria : >/= 84 yo   I have spoken and communicated the following to the patient or parent/caregiver:  1. FDA has authorized the emergency use of bamlanivimab and casirivimab\imdevimab for the treatment of mild to moderate COVID-19 in adults and pediatric patients with positive results of direct SARS-CoV-2 viral testing who are 68 years of age and older weighing at least 40 kg, and who are at high risk for progressing to severe COVID-19 and/or hospitalization.  2. The significant known and potential risks and benefits of bamlanivimab and casirivimab\imdevimab, and the extent to which such potential risks and benefits are unknown.  3. Information on available alternative treatments and the risks and benefits of those alternatives, including clinical trials.  4. Patients treated with bamlanivimab and casirivimab\imdevimab should continue to self-isolate and use infection control measures (e.g., wear mask, isolate, social distance, avoid sharing personal items, clean and disinfect "high touch" surfaces, and frequent handwashing) according to CDC guidelines.   5. The patient or parent/caregiver has the option to accept or  refuse bamlanivimab or casirivimab\imdevimab .  After reviewing this information with the patient, The patient agreed to proceed with receiving the bamlanimivab infusion and will be provided a copy of the Fact sheet prior to receiving the infusion.[name] 06/03/2019 10:05 AM Symptom onset 1/2

## 2019-06-06 ENCOUNTER — Ambulatory Visit (HOSPITAL_COMMUNITY)
Admission: RE | Admit: 2019-06-06 | Discharge: 2019-06-06 | Disposition: A | Discharge status: Home / Self Care | Payer: MEDICARE | Source: Ambulatory Visit | Attending: Pulmonary Disease | Admitting: Pulmonary Disease

## 2019-06-06 DIAGNOSIS — U071 COVID-19: Secondary | ICD-10-CM | POA: Insufficient documentation

## 2019-06-06 DIAGNOSIS — Z23 Encounter for immunization: Secondary | ICD-10-CM | POA: Diagnosis not present

## 2019-06-06 MED ORDER — METHYLPREDNISOLONE SODIUM SUCC 125 MG IJ SOLR: 125.0000 mg | Freq: Once | INTRAMUSCULAR | Status: DC | PRN

## 2019-06-06 MED ORDER — DIPHENHYDRAMINE HCL 50 MG/ML IJ SOLN: 50.0000 mg | Freq: Once | INTRAMUSCULAR | Status: DC | PRN

## 2019-06-06 MED ORDER — EPINEPHRINE 0.3 MG/0.3ML IJ SOAJ: 0.3000 mg | Freq: Once | INTRAMUSCULAR | Status: DC | PRN

## 2019-06-06 MED ORDER — ALBUTEROL SULFATE HFA 108 (90 BASE) MCG/ACT IN AERS: 2.0000 | INHALATION_SPRAY | Freq: Once | RESPIRATORY_TRACT | Status: DC | PRN

## 2019-06-06 MED ORDER — SODIUM CHLORIDE 0.9 % IV SOLN
INTRAVENOUS | Status: DC | PRN
  Administered 2019-06-06: 09:00:00 250 mL via INTRAVENOUS

## 2019-06-06 MED ORDER — FAMOTIDINE IN NACL 20-0.9 MG/50ML-% IV SOLN: 20.0000 mg | Freq: Once | INTRAVENOUS | Status: DC | PRN

## 2019-06-06 MED ORDER — SODIUM CHLORIDE 0.9 % IV SOLN
700.0000 mg | Freq: Once | INTRAVENOUS | Status: AC
  Administered 2019-06-06: 10:00:00 700 mg via INTRAVENOUS
  Filled 2019-06-06: qty 20

## 2019-06-06 NOTE — Progress Notes (Signed)
  Diagnosis: COVID-19  Physician: Dr. Joya Gaskins  Procedure: [facility] Med: bamlanivimab infusion - Provided patient with bamlanimivab fact sheet for patients, parents and caregivers prior to infusion.  Complications: No immediate complications noted.  Discharge: Discharged home   Tai, Hermelinda Medicus 06/06/2019

## 2019-06-06 NOTE — Discharge Instructions (Signed)

## 2019-09-13 ENCOUNTER — Other Ambulatory Visit
Admission: RE | Admit: 2019-09-13 | Discharge: 2019-09-13 | Disposition: A | Discharge status: Home / Self Care | Payer: Medicare Other | Source: Ambulatory Visit | Attending: Neurology | Admitting: Neurology

## 2019-09-13 DIAGNOSIS — Z79899 Other long term (current) drug therapy: Secondary | ICD-10-CM | POA: Diagnosis present

## 2019-09-13 LAB — PHENYTOIN LEVEL, TOTAL: Phenytoin Lvl: 11.4 ug/mL (ref 10.0–20.0)

## 2019-11-27 ENCOUNTER — Telehealth: Payer: Self-pay | Admitting: Nurse Practitioner

## 2019-11-27 NOTE — Telephone Encounter (Signed)
Spoke with patient's husband and have scheduled an In-person Consult for 12/04/19 @ 3:30 PM.

## 2019-12-04 ENCOUNTER — Encounter: Payer: Self-pay | Admitting: Nurse Practitioner

## 2019-12-04 ENCOUNTER — Other Ambulatory Visit: Payer: Medicare Other | Admitting: Nurse Practitioner

## 2019-12-04 ENCOUNTER — Other Ambulatory Visit: Payer: Self-pay

## 2019-12-04 DIAGNOSIS — Z515 Encounter for palliative care: Secondary | ICD-10-CM

## 2019-12-04 DIAGNOSIS — F02818 Dementia in other diseases classified elsewhere, unspecified severity, with other behavioral disturbance: Secondary | ICD-10-CM

## 2019-12-04 NOTE — Progress Notes (Signed)
[facility] Consult Note Telephone: [phone]  Fax: [phone]  PATIENT NAME: Lisa Eaton DOB: 08/03/1931 MRN: [id number]  PRIMARY CARE PROVIDER:   Kirk Ruths, MD  REFERRING PROVIDER:  Kirk Ruths, MD [facility] [place],  [place] [address]   I was asked to see Mrs. Stocking for Palliative care consult for medical goals of care   RESPONSIBLE PARTY:   Husband Magaly Pollina [phone]  RECOMMENDATIONS and PLAN:  1. ACP: DNR; MOST Form completed, placed in vynca, wishes are for limited interventions, treat what is treatable, hospitalization, IVF, Antibiotics; no feeding tube   2. Palliative care encounter; Palliative medicine team will continue to support patient, patient's family, and medical team. Visit consisted of counseling and education dealing with the complex and emotionally intense issues of symptom management and palliative care in the setting of serious and potentially life-threatening illness  I spent 90 minutes providing this consultation,  from 3:30pm to 5:00pm. More than 50% of the time in this consultation was spent coordinating communication.   HISTORY OF PRESENT ILLNESS:  Tashema Tiller is a 86 y.o. year old female with multiple medical problems including Alzheimer's dementia, seizure disorder, paralysis right hand, right leg, h/o TBI, h/o craniotomy, part of front lobe, history of skin cancer, hypertension, hard of hearing, anemia, cataract extraction with phaco right / left. Followed by Neurology with last visit with Rudie Meyer PA 09/13/2019 for hallucinations, mood disturbance improved while on Seroquel continue Citalopram. Alzheimer's disease continue on are set. Seizure disorder remains on Dilantin, Gabapentin. Imbalance multifactorial and largely related to neurodegenerative disease suspected Advanced Alzheimer's disease, right hemapheresis, and continents. Ms. Nannini resides  at home with her husband who is her primary care provider.Initial in person face-to-face visit with Mr. Wingate and Mrs. Cumpian. We talked about purpose of palliative care visit. Mr Passey in agreement. We talked about past medical history with initial diagnosis of Alzheimer's disease. We talked about the last time the solar a was independent driving about 4 years ago. We talked about slow Progressive functional and cognitive changes. We talked about over the last year she has become where she's not ambulatory. Mrs. Dogan is able to stand with assistance in pivot. Mrs Swiech is able to sit in a wheelchair and propel small distances with her feet. Mrs. Jorstad is now total ADL assistance for dressing, bathing, toileting. Mrs. Nabi is incontinent of bladder and intermittently continent of bowel. Mr. Veale showed the design of their house as he designed it for handicapped. Mr. Dethlefs had a shower with a rolling shower chair. Mr. Staib has a garden tub with a hoyer lift installed. Mr Nauman also has a raised commode with a wash handle for easy cleaning. Mr Rathe endorses Mrs. Klooster does frequently sleep in the recliner. Mr Spickard endorses he designed their bedroom to be able to incorporate a hospital bed when needed. Mr Tirone endorses Mrs Brumley used to have many falls up until now when Mrs. Cokley has not been able to walk. We talked about progression of dementia. Daughter [name] arrived and join palliative discussion. We talked about other functional changes including feeding. Mrs Roseman does continue to feed herself though it takes about 45 minutes. Mr Kerwood endorsed is she is having more trouble with utensils and they have transition to sippy cups. Mr Sokolow endorses that he does prepare finger foods what she does better with. Mr Karger endorses that she does not have difficulty  swallowing. [name] endorses with salad at time she does have coughing episodes but does not appear to be aspirating. Mr. Montalban  endorses Mrs. Derrick would pour coffee with difficulty processing and understanding. [name] talked about progressive changes with speech. Shea Stakes endorses she can see the slow progression and advancement of dementia. Mr Bueso endorses that he does take her on outings and they are getting ready to go to the mountains. Mr Attaway endorses they go out to restaurants several times a week and places of entertainment. Mr Deoliveira endorses it is getting more difficult to get her in and out of the bathroom. Mr Blanchett talked about prior to diagnosis and functional disability, Mrs. Feild was active, playing golf. We talked about history of traumatic brain injury and right-sided weakness which made it difficult at an early age for mobility though she was able to cross stitch and accomplish a task that she needed to. We talked about medical goals of care. We talked about health care power of attorney which would go to spouse Mr. Deacon. [name] is their only child. We talked about code status. Mr Blacksher endorses wishes are to be a DNR. We talked about [place] form and in agreement to complete, place in vynca. We talked about most format length. MOST form completed with wishes  limited treatment options, wishes for IV fluids, antibiotics. Mr Steury endorses wishes are no feeding tube. MOST form completed and reviewed, placed in vynca. We talked about role of palliative care and plan of care. We talked about hospice eligibility criteria. We talked about weight loss as she was 120 lb getting down to 104 around the time when she had covid. Mrs. Karl has now put 5 lb back. [name] endorses they have been feeding her cookies. We talked about continuing to monitor her weight and with functional decline if she begins to lose weight again will revisit option of Hospice Services. Mr Stolarz and Shea Stakes agreement. Follow-up appointment in 6 weeks or sooner should she declined, scheduled. Therapeutic listening. emotional support provided.  Contact information provided.  Palliative Care was asked to help to continue to address goals of care.   CODE STATUS: DNR  PPS: 30% HOSPICE ELIGIBILITY/DIAGNOSIS: TBD  PAST MEDICAL HISTORY:  Past Medical History:  Diagnosis Date  . Abnormal gait    DUE TO RIGHT SIDE PARALYSIS  . Anemia   . Cancer ([place])    SKIN  . Dizziness   . HOH (hard of hearing)   . Hypertension   . Partial paralysis of right hand (HCC)    partial paralysis right arm and right leg  . Pneumonia   . Seizures ([place])     SOCIAL HX:  Social History   Tobacco Use  . Smoking status: Never Smoker  . Smokeless tobacco: Never Used  Substance Use Topics  . Alcohol use: Yes    Alcohol/week: 1.0 standard drink    Types: 1 Glasses of wine per week    ALLERGIES:  Allergies  Allergen Reactions  . Penicillins Rash     PERTINENT MEDICATIONS:  Outpatient Encounter Medications as of 12/04/2019  Medication Sig  . amLODipine (NORVASC) 5 MG tablet Take 5 mg by mouth daily.  . cholecalciferol (VITAMIN D) 1000 UNITS tablet Take 1,000 Units by mouth daily.  Marland Kitchen donepezil (ARICEPT) 10 MG tablet Take 10 mg by mouth at bedtime.  Marland Kitchen doxycycline (VIBRAMYCIN) 100 MG capsule Take 1 capsule (100 mg total) by mouth 2 (two) times daily. (Patient not taking: Reported on 11/16/2016)  .  gabapentin (NEURONTIN) 600 MG tablet Take 600 mg by mouth 3 (three) times daily.   Marland Kitchen labetalol (NORMODYNE) 200 MG tablet Take 200 mg by mouth 2 (two) times daily.  Marland Kitchen losartan (COZAAR) 100 MG tablet Take 100 mg by mouth daily.  . phenytoin (DILANTIN) 100 MG ER capsule Take 1 capsule (100 mg total) by mouth 3 (three) times daily. (Patient taking differently: Take 100 mg by mouth 2 (two) times daily. 1 PILL AM 2PILLS PM)  . primidone (MYSOLINE) 250 MG tablet Take 250 mg by mouth 2 (two) times daily.  . raloxifene (EVISTA) 60 MG tablet Take 60 mg by mouth daily.  . vitamin B-12 (CYANOCOBALAMIN) 1000 MCG tablet Take 1,000 mcg by mouth daily.   No  facility-administered encounter medications on file as of 12/04/2019.    PHYSICAL EXAM:   General: NAD, frail appearing, thin, confused debilitated, female Neurological: non-ambulatory  Amadu Schlageter Z Javona Bergevin, NP

## 2020-01-03 ENCOUNTER — Telehealth: Payer: Self-pay | Admitting: Nurse Practitioner

## 2020-01-03 NOTE — Telephone Encounter (Signed)
Spoke with husband to see if we could possibly reschedule the Palliative f/u visit on 01/22/20 to 01/28/20 @ 2 PM.  He requested that I text him this information and he would check his calendar when he got back home and he would let me know if this was okay or not.

## 2020-01-03 NOTE — Telephone Encounter (Signed)
Rec'd message from husband, Denyse Amass, stating that it was okay to reschedule the Palliative f/u visit to 01/28/20 @ 2 PM.

## 2020-01-22 ENCOUNTER — Other Ambulatory Visit: Payer: Medicare Other | Admitting: Nurse Practitioner

## 2020-01-28 ENCOUNTER — Other Ambulatory Visit: Payer: Self-pay

## 2020-01-28 ENCOUNTER — Other Ambulatory Visit: Payer: Medicare Other | Admitting: Nurse Practitioner

## 2020-01-29 ENCOUNTER — Telehealth: Payer: Self-pay | Admitting: Nurse Practitioner

## 2020-01-29 NOTE — Telephone Encounter (Signed)
Rec'd call back from husband and we have rescheduled the Palliative f/u visit for 02/18/20 @ 4 PM.

## 2020-01-29 NOTE — Telephone Encounter (Signed)
Called patient's husband to reschedule the 8/30 Palliative f/u visit (NP had family emergency), no answer - left message with my contact name and number requesting a return call to reschedule visit.

## 2020-02-06 ENCOUNTER — Telehealth: Payer: Self-pay

## 2020-02-06 NOTE — Telephone Encounter (Signed)
Palliative care volunteer call made, no answer

## 2020-02-18 ENCOUNTER — Encounter: Payer: Self-pay | Admitting: Nurse Practitioner

## 2020-02-18 ENCOUNTER — Other Ambulatory Visit: Payer: Self-pay

## 2020-02-18 ENCOUNTER — Other Ambulatory Visit: Payer: Medicare Other | Admitting: Nurse Practitioner

## 2020-02-18 DIAGNOSIS — Z515 Encounter for palliative care: Secondary | ICD-10-CM

## 2020-02-18 DIAGNOSIS — F02818 Dementia in other diseases classified elsewhere, unspecified severity, with other behavioral disturbance: Secondary | ICD-10-CM

## 2020-02-18 NOTE — Progress Notes (Signed)
[facility] Consult Note Telephone: [phone]  Fax: [phone]  PATIENT NAME: Lisa Eaton DOB: August 28, 1931 MRN: [id number]  PRIMARY CARE PROVIDER:   Kirk Ruths, MD  REFERRING PROVIDER:  Kirk Ruths, MD [facility] [place],  [place] [address] RESPONSIBLE PARTY:   Husband Tonya Wantz [id number]  RECOMMENDATIONS and PLAN: 1.ACP: DNR; MOST Form in vynca, wishes are for limited interventions, treat what is treatable, hospitalization, IVF, Antibiotics; no feeding tube  2.Palliative care encounter; Palliative medicine team will continue to support patient, patient's family, and medical team. Visit consisted of counseling and education dealing with the complex and emotionally intense issues of symptom management and palliative care in the setting of serious and potentially life-threatening illness  3. F/u 2 months for ongoing monitoring of progression of dementia, appetite, weight  I spent 65 minutes providing this consultation,  from 4:00pm to 5:05pm. More than 50% of the time in this consultation was spent coordinating communication.   HISTORY OF PRESENT ILLNESS:  Lisa Eaton is a 86 y.o. year old female with multiple medical problems including Alzheimer's dementia, seizure disorder, paralysis right hand, right leg, h/o TBI, h/o craniotomy, part of front lobe, history of skin cancer, hypertension, hard of hearing, anemia, cataract extraction with phaco right / left. In-person follow-up palliative care visit with Mr. and Mrs. Stoiber. We talked about how [name] has been feeling. We talked about purpose of palliative care visit. [name] in agreement. We talked about their daily routine. We talked about symptoms of pain what she does exhibit in her lower back at times. Dr. Rosaria Ferries has taken her to the chiropractor which seems to help. Dr. Rosaria Ferries  put a brace on her right foot to help prevent  foot drop. Dr. Rosaria Ferries talked about using tylenol and ibuprofen for back pain which seems to help. Dr. Rosaria Ferries endorses when she sleeps in her bed she seems to develop more pain as opposed when she sleeps in the recliner. We talked about positioning. We talked about adl's what she does require total ADL dependence for bathing, dressing and is incontinent.  We talked about the modifications he made in the bathroom including a lift to get her in the bath. Dr. Rosaria Ferries talked about the sit-to-stand lift that he ordered that is come in. Dr Rosaria Ferries endorses he will need to do some modifications to. We talked about physical therapy which recently has been ordered for strengthening. Dr Rosaria Ferries endorses the physical therapist said that they may even be able to get her to walk. We talked about chronic disease progression and the impact on the brain. We talked about goals and purpose of therapy. We talked about the goal of ambulating though that would be self-limiting timewise.  Walking may put her at a fall risk as she was prior to stop walking. [name] when she was walking she was falling frequently. We talked about spending the time with the physical therapist doing things for safety such as transferring, exercises. We talked about you think that quality time with the therapist to get the most out of each visit with realistic goals and expectations. We talked about the shoes as he modified her right shoe for the brace. We talked about the support in the she's as opposed to the socks with the no slip bottoms. We talked about [name] does feed herself which she has more difficulty with utensils. We talked about [name] does better with  finger foods. We talked about her appetite wish seems to have improved. Dr Rosaria Ferries endorses Ms. Bussey does really like cookies. Dr Rosaria Ferries endorses [name] has gained a pound or two and she does look fuller and her face. We talked about behaviors. We talked about activity matt for  dementia patients and what that looks like. Dr Rosaria Ferries endorses he will look to see if he can locate one with zippers and snacks to keep her occupied possibly during the day rather than taking her brace and shoes off. Dr. Rosaria Ferries talked about their social life as he does take her out to dinner, lunches, musical events. Dr Rosaria Ferries endorses they recently did a few trips to the mountains but he was a little challenged with the facilities that were provided for handicapped. Dr Rosaria Ferries  endorses that he is the perfect set up at his home with the shower, the shower chair. Dr Rosaria Ferries endorses he just ordered a shower chair that reclines so she can get her hair washed without getting the soap in her eyes. We talked about medical goals of care. [name] and I talked about his age and concern if something were to happen to him what the plan was in place for their daughter, [name]. Dr. Rosaria Ferries endorses he has not talked to his daughter [name] about should something happen to him. [name] endorse he will talk to family. Dr. Rosaria Ferries endorses he would hire 24-hour caregiver. [name] endorse now have two current caregivers in place for each day. We talked about caregiver stressors and fatigue, burnout. [name] endorses that he is doing well getting rest. [name] endorses that he does take some time for himself. We talked about upcoming events which they are expecting great grand twins in about two weeks, their granddaughter completed her boards for nurse practitioner, and are currently residing with them as well as their daughter who is a Marine scientist. Dr Rosaria Ferries endorses they have a full house. We talked about the upcoming holidays that will be enjoyed with the new twins and gift of family. We talked about role palliative care and plan of care. We talked about follow up palliative care visit in two months if needed or sooner should she declined. Dr. Rosaria Ferries in agreement. Appointment scheduled. Therapeutic listening and emotional  support provided. Contact information. Questions answered to satisfaction.  Palliative Care was asked to help to  continue to address goals of care.   CODE STATUS: DNR  PPS: 40% HOSPICE ELIGIBILITY/DIAGNOSIS: TBD  PAST MEDICAL HISTORY:  Past Medical History:  Diagnosis Date  . Abnormal gait    DUE TO RIGHT SIDE PARALYSIS  . Anemia   . Cancer ([facility])    SKIN  . Dizziness   . HOH (hard of hearing)   . Hypertension   . Partial paralysis of right hand (HCC)    partial paralysis right arm and right leg  . Pneumonia   . Seizures ([place])     SOCIAL HX:  Social History   Tobacco Use  . Smoking status: Never Smoker  . Smokeless tobacco: Never Used  Substance Use Topics  . Alcohol use: Yes    Alcohol/week: 1.0 standard drink    Types: 1 Glasses of wine per week    ALLERGIES:  Allergies  Allergen Reactions  . Penicillins Rash     PERTINENT MEDICATIONS:  Outpatient Encounter Medications as of 02/18/2020  Medication Sig  . amLODipine (NORVASC) 5 MG tablet Take 5 mg by mouth daily.  . cholecalciferol (VITAMIN  D) 1000 UNITS tablet Take 1,000 Units by mouth daily.  Marland Kitchen donepezil (ARICEPT) 10 MG tablet Take 10 mg by mouth at bedtime.  Marland Kitchen doxycycline (VIBRAMYCIN) 100 MG capsule Take 1 capsule (100 mg total) by mouth 2 (two) times daily. (Patient not taking: Reported on 11/16/2016)  . gabapentin (NEURONTIN) 600 MG tablet Take 600 mg by mouth 3 (three) times daily.   Marland Kitchen labetalol (NORMODYNE) 200 MG tablet Take 200 mg by mouth 2 (two) times daily.  Marland Kitchen losartan (COZAAR) 100 MG tablet Take 100 mg by mouth daily.  . phenytoin (DILANTIN) 100 MG ER capsule Take 1 capsule (100 mg total) by mouth 3 (three) times daily. (Patient taking differently: Take 100 mg by mouth 2 (two) times daily. 1 PILL AM 2PILLS PM)  . primidone (MYSOLINE) 250 MG tablet Take 250 mg by mouth 2 (two) times daily.  . raloxifene (EVISTA) 60 MG tablet Take 60 mg by mouth daily.  . vitamin B-12 (CYANOCOBALAMIN) 1000 MCG tablet  Take 1,000 mcg by mouth daily.   No facility-administered encounter medications on file as of 02/18/2020.    PHYSICAL EXAM:   General: NAD, frail appearing, thin, debilitated confused female Cardiovascular: regular rate and rhythm Pulmonary: clear ant fields Extremities: muscle wasting; atrophy Neurological: w/c dependent; non-ambulatory  Dillin Lofgren Z Peggi Yono, NP

## 2020-04-01 ENCOUNTER — Other Ambulatory Visit: Payer: Medicare Other | Admitting: Nurse Practitioner

## 2020-04-01 ENCOUNTER — Other Ambulatory Visit: Payer: Self-pay

## 2020-04-01 ENCOUNTER — Encounter: Payer: Self-pay | Admitting: Nurse Practitioner

## 2020-04-01 DIAGNOSIS — F02818 Dementia in other diseases classified elsewhere, unspecified severity, with other behavioral disturbance: Secondary | ICD-10-CM

## 2020-04-01 DIAGNOSIS — Z515 Encounter for palliative care: Secondary | ICD-10-CM

## 2020-04-01 DIAGNOSIS — F0281 Dementia in other diseases classified elsewhere with behavioral disturbance: Secondary | ICD-10-CM

## 2020-04-01 NOTE — Progress Notes (Signed)
[facility] Consult Note Telephone: [phone]  Fax: [phone]  PATIENT NAME: Lisa Eaton DOB: May 14, 1932 MRN: [id number]  PRIMARY CARE PROVIDER:   Kirk Ruths, MD  REFERRING PROVIDER:  Kirk Ruths, MD [facility] [place],  [place] [address]  RESPONSIBLE PARTY:Husband Seham Gardenhire [id number]  RECOMMENDATIONS and PLAN: 1.ACP:DNR; MOST Form in vynca, wishes are for limited interventions, treat what is treatable, hospitalization, IVF, Antibiotics; no feeding tube  Left arm; 7 1/2 inches Left leg: 14 inches  2.Palliative care encounter; Palliative medicine team will continue to support patient, patient's family, and medical team. Visit consisted of counseling and education dealing with the complex and emotionally intense issues of symptom management and palliative care in the setting of serious and potentially life-threatening illness  3. F/u 1 months for ongoing monitoring of progression of dementia, appetite, weight  I spent 60 minutes providing this consultation,  from 3:45pm to 4:45pm. More than 50% of the time in this consultation was spent coordinating communication.   HISTORY OF PRESENT ILLNESS:  Leatha Rohner is a 86 y.o. year old female with multiple medical problems including Alzheimer's dementia, seizure disorder, paralysis right hand, right leg, h/o TBI, h/o craniotomy, part of front lobe, history of skin cancer, hypertension, hard of hearing, anemia, cataract extraction with phaco right / left. Palliative care follow-up visit for Mrs. Skoog her home. I met with Dr Rosaria Ferries and Mrs. Minium . Mrs. Hartl was sitting in the recliner in their study, appears comfortable, thin, confused. We talked about purpose of visit. We talked about how Mrs. Kanady has been doing. Dr Rosaria Ferries endorses that Mrs. Braziel  been doing about the same. Mrs. Brodhead having a little bit more trouble with  feeding herself as utensils. Mrs. Slocumb  having a hard time figuring out how to use them. Dr. Rosaria Ferries endorses it does take her some time to eat. Dr. Rosaria Ferries endorses she does continue to seem to have a good appetite. We talked about measuring the salaries arm and leg for muscle mass. We talked about the purpose behind as opposed to weighing Mrs. Vanderploeg as it would be difficult with her total dependence. Dr Rosaria Ferries and I talked about her functional decline overall. Mrs. Bibb is using a brace for her left ankle which keeps her foot more stable and flat on the floor which makes it easier for him to stand and pivot while on the commode. We talked about the brace and it does not appear that Mrs. Haro  taking it off as she previously was. Dr Rosaria Ferries endorses he got a shoe 3 sizes big for her foot after taking her to a shoe store. We talked about physical therapy. We talked about behaviors for which she has become a little bit more irritable in the late afternoons and bathing. Dr Rosaria Ferries talked with the Neurologist who prescribe Seroquel 35m prior to bathing. Dr URosaria Ferriesendorses they only bath her once a week and it actually has made a significant difference she was more cooperative during bath time. We talked about sundowners and her irritability in the afternoon. Dr. URosaria Ferriesendorses when she does become irritable he puts her in the car and takes her for a drive. They go out for about an hour hour-and-a-half and come back Mrs. Suire  much more calm and pleasant. We talked about caregiver fatigue and stressors, coping strategies. Dr. URosaria Ferriesendorses he continues to take her to dinner and restaurants, as they  recently saw the [place]. Dr. Rosaria Ferries endorses they have multiple engagements coming up for entertainment as he does try to continue to take her out places. We talked about challenges with incontinence. Shared with Dr Rosaria Ferries [facility] system that could be an option possibly down the road when she is more  incontinent. Dr Rosaria Ferries endorses he will look into it and do more research. We talked about role of Palliative care in plan of care. Discuss that will follow up in 4 weeks to continue to measure, monitoring appetite and weight. We did talk about hospice briefly and eligibility with 10% of weight loss though would have to forego any type or go to therapy. We talked about at this point in time Mrs. Primiano does not appear eligible though if she does continue to inches will monitor can follow closely. Dr Rosaria Ferries talked about their great grandson and daughter that was just born, they are a 67 old twins. Dr. Rosaria Ferries talked about their daughter, granddaughter and husband, twins all continue to reside with them in addition to multiple dogs and goats outside. Therapeutic listening and emotional support provided. Questions answered the satisfaction.  Palliative Care was asked to help to continue to address goals of care.   CODE STATUS: DNR  PPS: 40% HOSPICE ELIGIBILITY/DIAGNOSIS: TBD  PAST MEDICAL HISTORY:  Past Medical History:  Diagnosis Date  . Abnormal gait    DUE TO RIGHT SIDE PARALYSIS  . Anemia   . Cancer ([facility])    SKIN  . Dizziness   . HOH (hard of hearing)   . Hypertension   . Partial paralysis of right hand (HCC)    partial paralysis right arm and right leg  . Pneumonia   . Seizures ([facility])     SOCIAL HX:  Social History   Tobacco Use  . Smoking status: Never Smoker  . Smokeless tobacco: Never Used  Substance Use Topics  . Alcohol use: Yes    Alcohol/week: 1.0 standard drink    Types: 1 Glasses of wine per week    ALLERGIES:  Allergies  Allergen Reactions  . Penicillins Rash      PHYSICAL EXAM:   General: NAD, frail appearing, thin, debilitated, confused pleasant female Cardiovascular: regular rate and rhythm Pulmonary: clear ant fields Extremities: no edema, no joint deformities; Mild muscle wasting Neurological: non-ambulatory; lift  Haydn Cush Z Kacey Dysert, NP

## 2020-06-17 ENCOUNTER — Other Ambulatory Visit: Payer: Medicare Other | Admitting: Nurse Practitioner

## 2020-06-17 ENCOUNTER — Other Ambulatory Visit: Payer: Self-pay

## 2020-06-17 ENCOUNTER — Telehealth: Payer: Self-pay | Admitting: Nurse Practitioner

## 2020-06-17 NOTE — Telephone Encounter (Signed)
I called Dr Rosaria Ferries to confirm f/u PC visit, Dr Rosaria Ferries endorses Lisa Eaton has been doing okay and wishes to reschedule to the inclement weather, rescheduled per request

## 2020-07-28 ENCOUNTER — Other Ambulatory Visit: Payer: Medicare Other | Admitting: Nurse Practitioner

## 2020-07-29 DEATH — deceased
